# Patient Record
Sex: Female | Born: 1965 | Race: White | Hispanic: No | Marital: Married | Smoking: Former smoker
Health system: Southern US, Community
[De-identification: ages and names within clinical notes are randomized; demographics above are authoritative.]

## PROBLEM LIST (undated history)

## (undated) DIAGNOSIS — A630 Anogenital (venereal) warts: Secondary | ICD-10-CM

## (undated) DIAGNOSIS — Z860100 Personal history of colon polyps, unspecified: Secondary | ICD-10-CM

## (undated) DIAGNOSIS — G4700 Insomnia, unspecified: Secondary | ICD-10-CM

## (undated) DIAGNOSIS — M79604 Pain in right leg: Secondary | ICD-10-CM

## (undated) DIAGNOSIS — M79605 Pain in left leg: Secondary | ICD-10-CM

## (undated) DIAGNOSIS — K219 Gastro-esophageal reflux disease without esophagitis: Secondary | ICD-10-CM

## (undated) DIAGNOSIS — F988 Other specified behavioral and emotional disorders with onset usually occurring in childhood and adolescence: Secondary | ICD-10-CM

## (undated) DIAGNOSIS — E538 Deficiency of other specified B group vitamins: Secondary | ICD-10-CM

## (undated) DIAGNOSIS — Z8619 Personal history of other infectious and parasitic diseases: Secondary | ICD-10-CM

## (undated) DIAGNOSIS — E559 Vitamin D deficiency, unspecified: Secondary | ICD-10-CM

## (undated) DIAGNOSIS — D649 Anemia, unspecified: Secondary | ICD-10-CM

## (undated) DIAGNOSIS — F329 Major depressive disorder, single episode, unspecified: Secondary | ICD-10-CM

## (undated) DIAGNOSIS — Q823 Incontinentia pigmenti: Secondary | ICD-10-CM

## (undated) DIAGNOSIS — M797 Fibromyalgia: Secondary | ICD-10-CM

## (undated) DIAGNOSIS — F32A Depression, unspecified: Secondary | ICD-10-CM

## (undated) HISTORY — DX: Gastro-esophageal reflux disease without esophagitis: K21.9

## (undated) HISTORY — DX: Insomnia, unspecified: G47.00

## (undated) HISTORY — DX: Anemia, unspecified: D64.9

## (undated) HISTORY — DX: Deficiency of other specified B group vitamins: E53.8

## (undated) HISTORY — PX: ABDOMINAL HYSTERECTOMY: SHX81

## (undated) HISTORY — DX: Personal history of other infectious and parasitic diseases: Z86.19

## (undated) HISTORY — DX: Personal history of colonic polyps: Z86.010

## (undated) HISTORY — DX: Depression, unspecified: F32.A

## (undated) HISTORY — DX: Fibromyalgia: M79.7

## (undated) HISTORY — DX: Anogenital (venereal) warts: A63.0

## (undated) HISTORY — PX: OTHER SURGICAL HISTORY: SHX169

## (undated) HISTORY — PX: COLONOSCOPY: SHX174

## (undated) HISTORY — DX: Major depressive disorder, single episode, unspecified: F32.9

## (undated) HISTORY — DX: Other specified behavioral and emotional disorders with onset usually occurring in childhood and adolescence: F98.8

## (undated) HISTORY — DX: Personal history of colon polyps, unspecified: Z86.0100

## (undated) HISTORY — DX: Incontinentia pigmenti: Q82.3

## (undated) HISTORY — PX: GASTRIC BYPASS: SHX52

---

## 2002-02-13 ENCOUNTER — Other Ambulatory Visit: Admission: RE | Admit: 2002-02-13 | Discharge: 2002-02-13 | Payer: Self-pay | Admitting: Family Medicine

## 2005-06-23 ENCOUNTER — Ambulatory Visit: Payer: Self-pay | Admitting: Family Medicine

## 2005-07-06 ENCOUNTER — Encounter: Admission: RE | Admit: 2005-07-06 | Discharge: 2005-07-06 | Payer: Self-pay | Admitting: Family Medicine

## 2005-07-12 ENCOUNTER — Ambulatory Visit: Payer: Self-pay | Admitting: Family Medicine

## 2006-01-25 ENCOUNTER — Ambulatory Visit: Payer: Self-pay | Admitting: Family Medicine

## 2006-05-30 ENCOUNTER — Ambulatory Visit: Payer: Self-pay | Admitting: Family Medicine

## 2006-11-22 ENCOUNTER — Emergency Department (HOSPITAL_COMMUNITY): Admission: EM | Admit: 2006-11-22 | Discharge: 2006-11-22 | Payer: Self-pay | Admitting: Emergency Medicine

## 2007-01-25 ENCOUNTER — Ambulatory Visit: Payer: Self-pay | Admitting: Family Medicine

## 2007-01-25 DIAGNOSIS — G4700 Insomnia, unspecified: Secondary | ICD-10-CM | POA: Insufficient documentation

## 2007-01-25 DIAGNOSIS — Z860100 Personal history of colon polyps, unspecified: Secondary | ICD-10-CM | POA: Insufficient documentation

## 2007-01-25 DIAGNOSIS — K219 Gastro-esophageal reflux disease without esophagitis: Secondary | ICD-10-CM

## 2007-01-25 DIAGNOSIS — Z9189 Other specified personal risk factors, not elsewhere classified: Secondary | ICD-10-CM | POA: Insufficient documentation

## 2007-01-25 DIAGNOSIS — Q998 Other specified chromosome abnormalities: Secondary | ICD-10-CM

## 2007-01-25 DIAGNOSIS — L259 Unspecified contact dermatitis, unspecified cause: Secondary | ICD-10-CM | POA: Insufficient documentation

## 2007-01-25 DIAGNOSIS — A630 Anogenital (venereal) warts: Secondary | ICD-10-CM

## 2007-07-19 ENCOUNTER — Telehealth: Payer: Self-pay | Admitting: Family Medicine

## 2007-09-28 ENCOUNTER — Telehealth: Payer: Self-pay | Admitting: Family Medicine

## 2007-09-29 ENCOUNTER — Ambulatory Visit: Payer: Self-pay | Admitting: Family Medicine

## 2008-01-29 ENCOUNTER — Telehealth: Payer: Self-pay | Admitting: Family Medicine

## 2008-03-08 ENCOUNTER — Ambulatory Visit: Payer: Self-pay | Admitting: Family Medicine

## 2008-03-08 DIAGNOSIS — IMO0001 Reserved for inherently not codable concepts without codable children: Secondary | ICD-10-CM

## 2008-03-11 ENCOUNTER — Telehealth: Payer: Self-pay | Admitting: Family Medicine

## 2008-03-11 LAB — CONVERTED CEMR LAB
ALT: 18 units/L (ref 0–35)
AST: 17 units/L (ref 0–37)
Alkaline Phosphatase: 44 units/L (ref 39–117)
Anti Nuclear Antibody(ANA): NEGATIVE
BUN: 9 mg/dL (ref 6–23)
Bilirubin, Direct: 0.1 mg/dL (ref 0.0–0.3)
CO2: 28 meq/L (ref 19–32)
CRP, High Sensitivity: <1 — ABNORMAL LOW (ref 0.00–5.00)
Chloride: 108 meq/L (ref 96–112)
Creatinine, Ser: 0.6 mg/dL (ref 0.4–1.2)
Eosinophils Relative: 0.8 % (ref 0.0–5.0)
GFR calc Af Amer: 140 mL/min
Hemoglobin: 8.2 g/dL — ABNORMAL LOW (ref 12.0–15.0)
Lymphocytes Relative: 31.6 % (ref 12.0–46.0)
MCV: 70.3 fL — ABNORMAL LOW (ref 78.0–100.0)
Neutro Abs: 3.6 10*3/uL (ref 1.4–7.7)
Neutrophils Relative %: 58 % (ref 43.0–77.0)
Platelets: 264 10*3/uL (ref 150–400)
RBC: 3.64 M/uL — ABNORMAL LOW (ref 3.87–5.11)
RDW: 14.2 % (ref 11.5–14.6)
Rhuematoid fact SerPl-aCnc: <20 intl units/mL — ABNORMAL LOW (ref 0.0–20.0)
TSH: 1.39 microintl units/mL (ref 0.35–5.50)
Total Protein: 6.3 g/dL (ref 6.0–8.3)
WBC: 6.3 10*3/uL (ref 4.5–10.5)

## 2008-07-15 ENCOUNTER — Telehealth: Payer: Self-pay | Admitting: Family Medicine

## 2008-10-24 IMAGING — CR DG ABDOMEN ACUTE W/ 1V CHEST
3 series · 3 of 3 positions shown · non-contrast
Comparison: None available.

CLINICAL DATA: Chest pain, abdominal pain. 
 ACUTE ABDOMEN WITH CHEST ? 11/22/06:

[w chest pa]
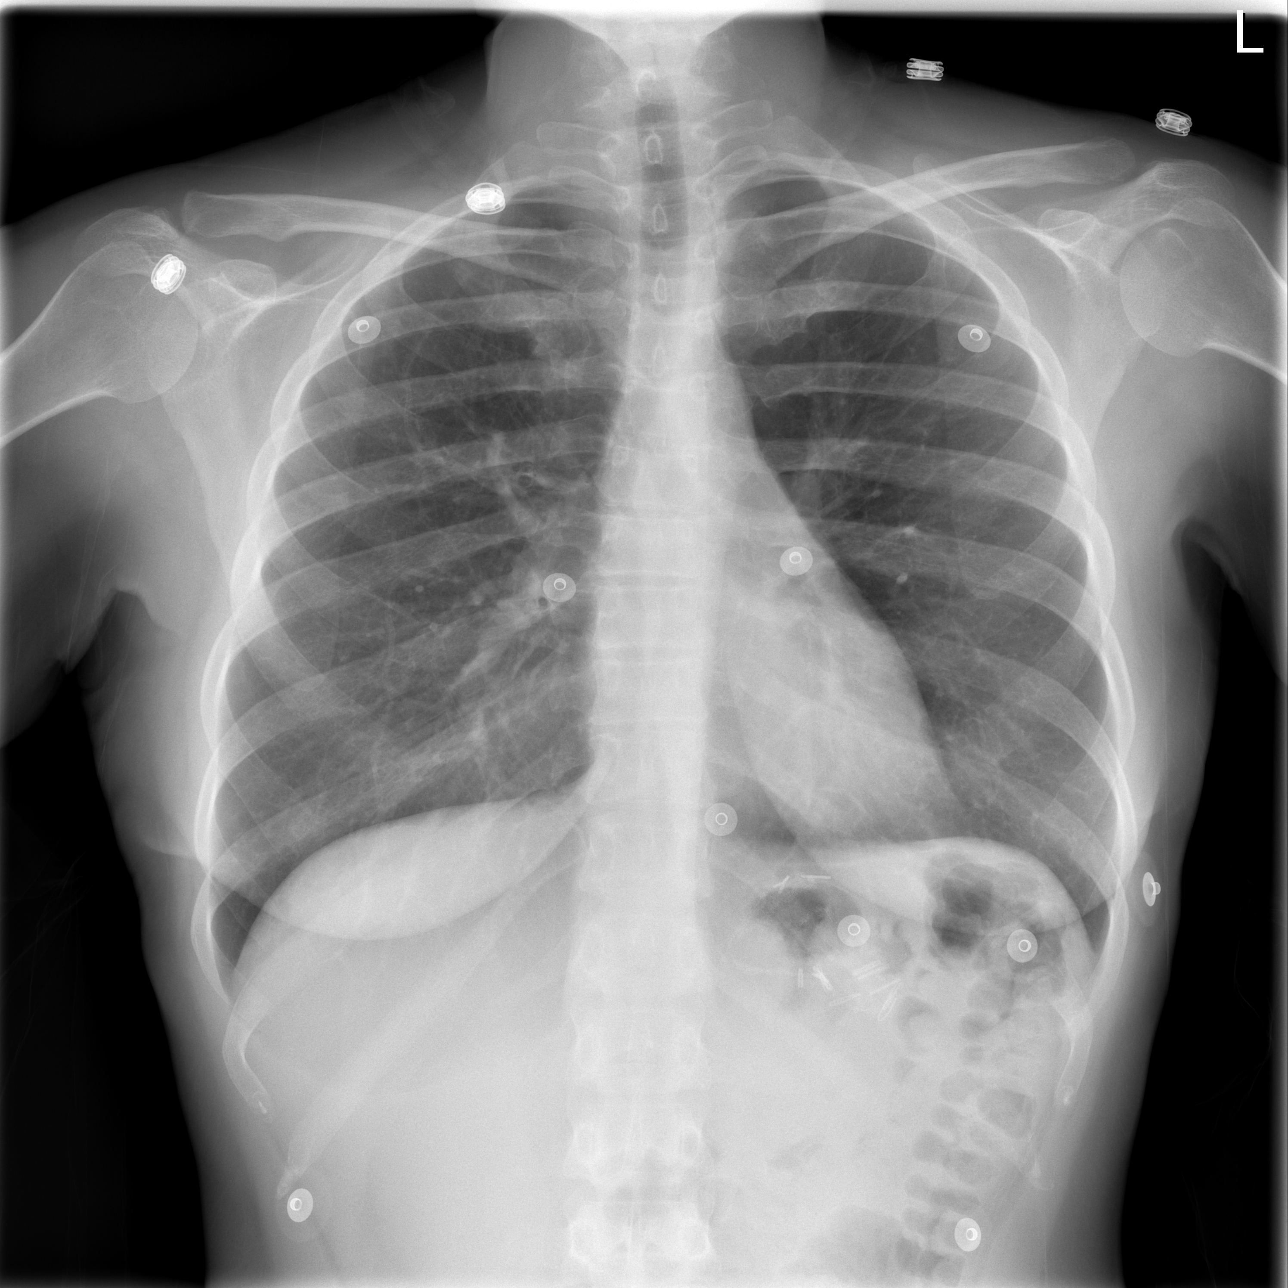

[w abdomen upright]
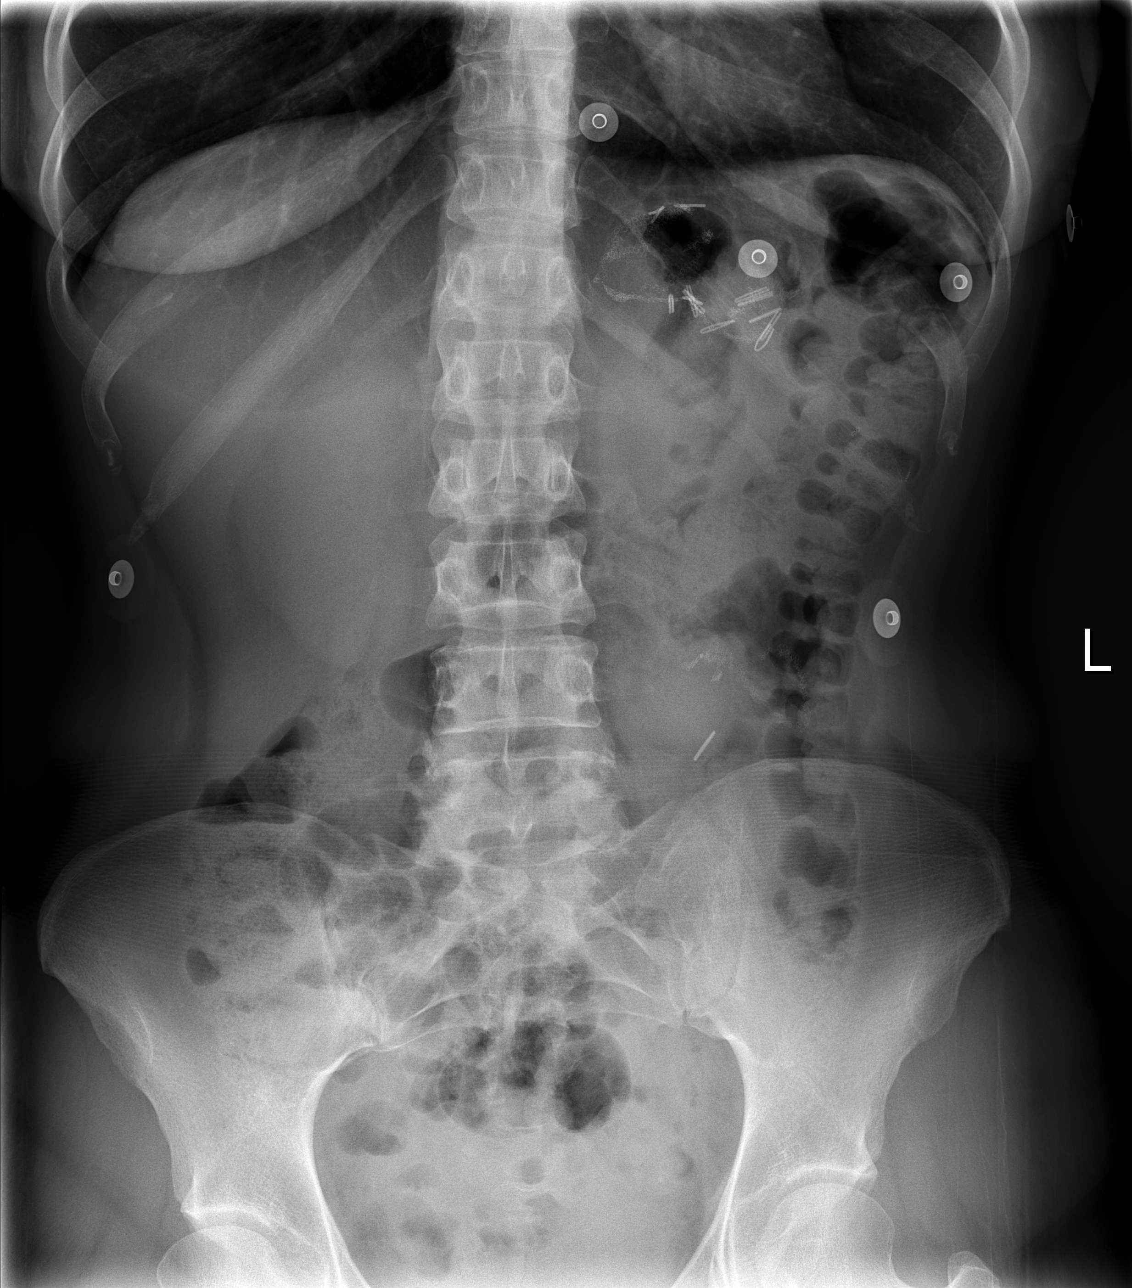

[t abdomen supine]
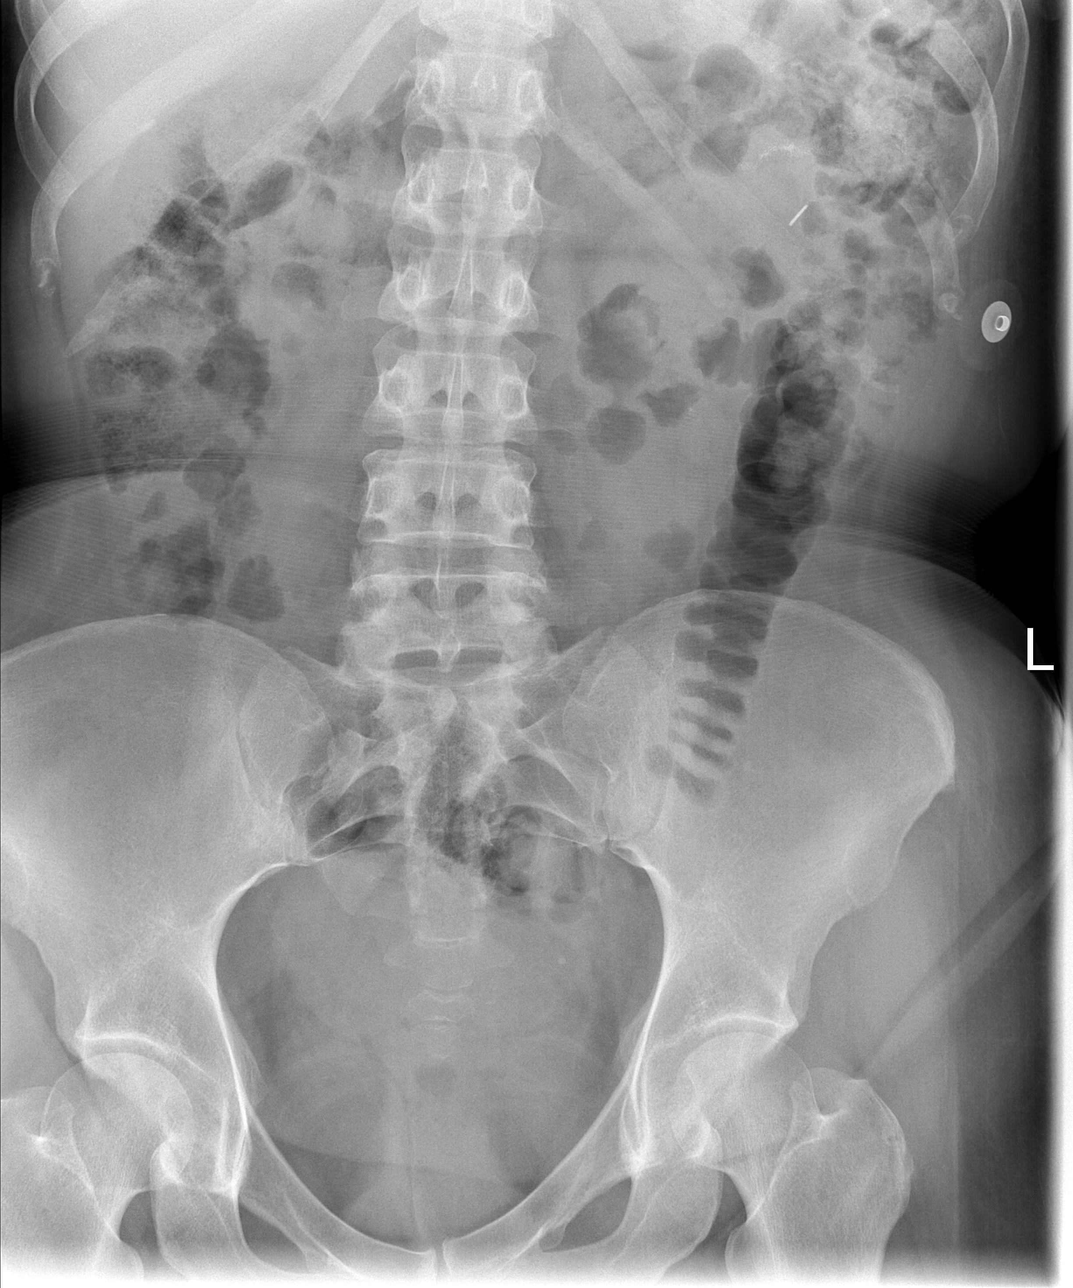

[3 of 3 positions shown; findings below may reference images not displayed]

FINDINGS: Normal mediastinum and cardiac silhouette.  No evidence of free air beneath the hemidiaphragms. Normal pulmonary vasculature.  No evidence of infiltrate.  Supine and upright views of the abdomen demonstrate no evidence of dilated loops of large or small bowel.  Gas is present in the rectum.   Formed stool is present in the ascending and transverse colon.   Multiple surgical clips in the left upper quadrant, most consistent with gastric bypass surgery.  No pathologic calcifications evident.
IMPRESSION: 1.  No evidence of intraperitoneal free air. 
 2.  No evidence of bowel obstruction. 
 3.   No acute cardiopulmonary disease.

## 2008-10-24 IMAGING — CT CT ABDOMEN W/ CM
2 of 5 series · 17 of 46 positions shown, 19 images · IV contrast (APPLIED)
Comparison: None available.

CLINICAL DATA: Left chest and epigastric pain for approximately two months, worsening over the last day.  History of gastric bypass one year ago.  
 ABDOMEN CT WITH CONTRAST:
TECHNIQUE: Multidetector CT imaging of the abdomen was performed following the standard protocol during bolus administration of intravenous contrast.
 Contrast:  100 cc Omnipaque 300.  Oral contrast was given.
TECHNIQUE: Multidetector CT imaging of the pelvis was performed following the standard protocol during bolus administration of intravenous contrast.

[Series 2: abd/pelv with 5.0 b31f st · axial · 0.71mm/px · z∈[-486,-76]mm · 14 of 92 slices shown, 16 images]
[im 5/92  soft-tissue]
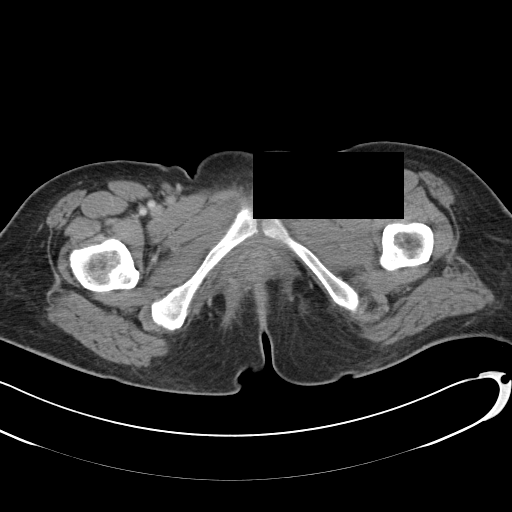
[im 5/92  bone]
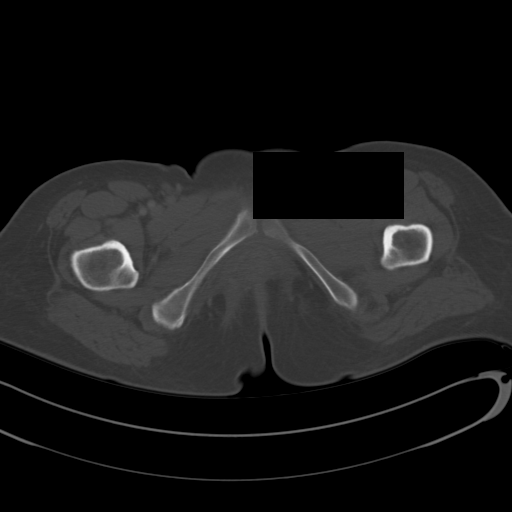
[im 10/92  soft-tissue]
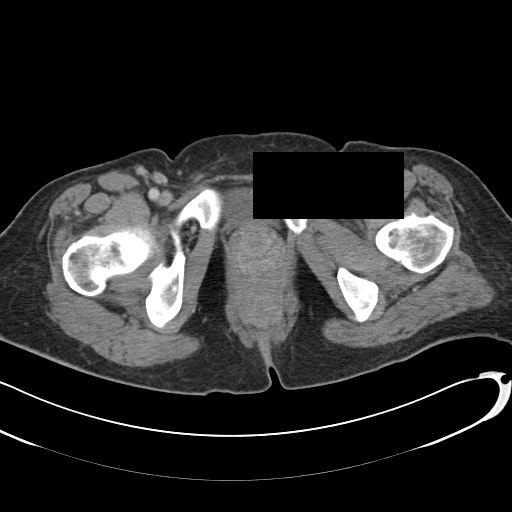
[im 20/92  soft-tissue]
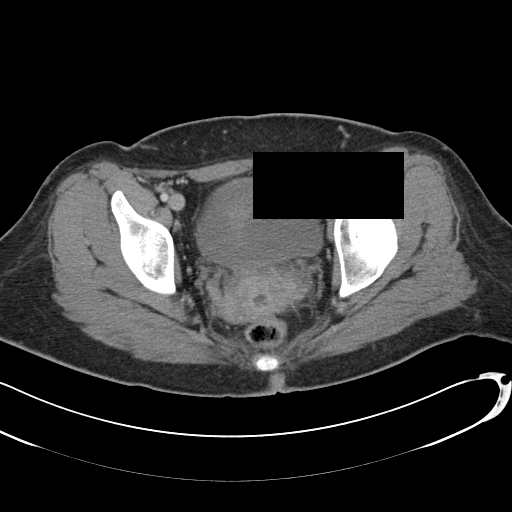
[im 24/92  soft-tissue]
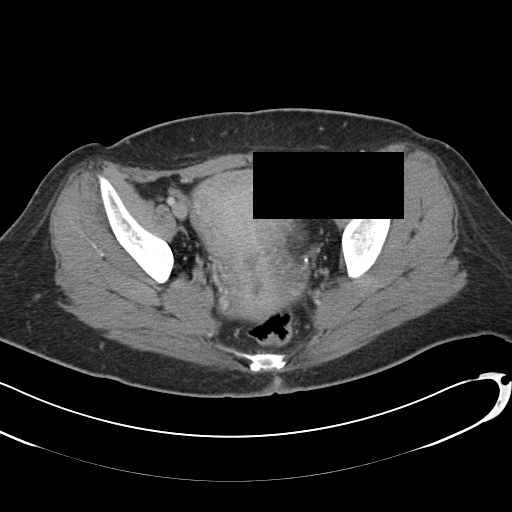
[im 29/92  soft-tissue]
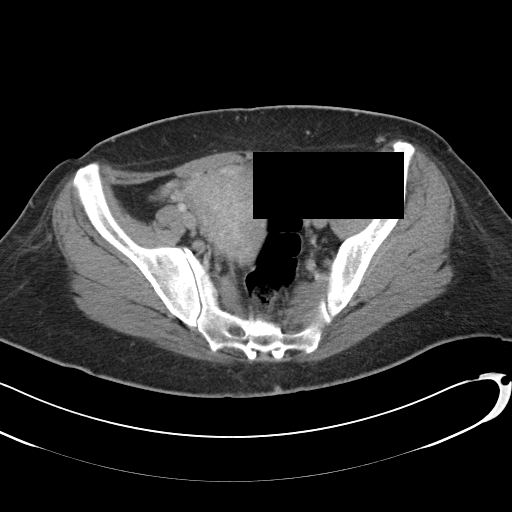
[im 39/92  soft-tissue]
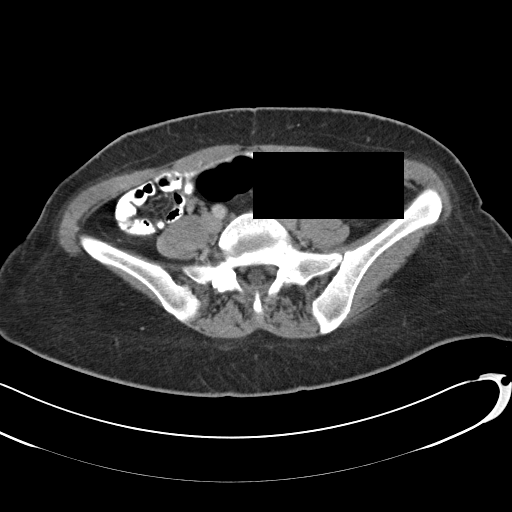
[im 44/92  soft-tissue]
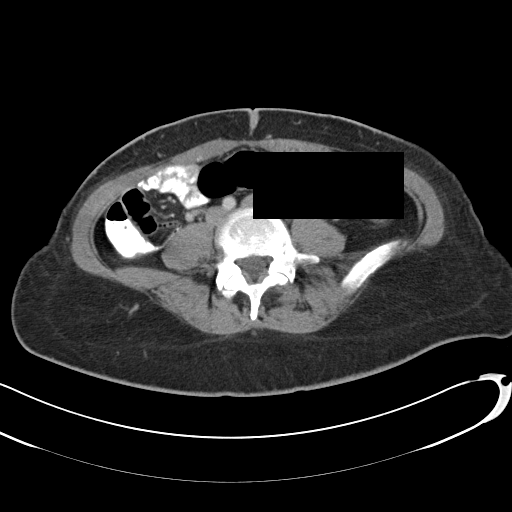
[im 48/92  soft-tissue]
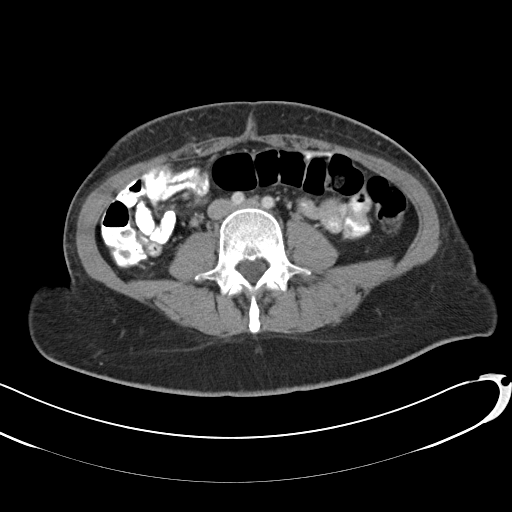
[im 53/92  soft-tissue]
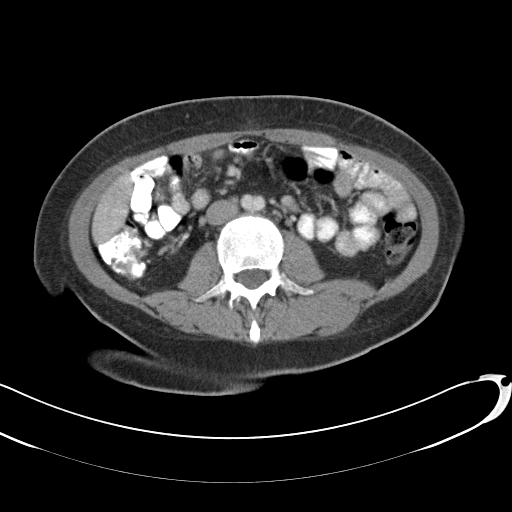
[im 53/92  bone]
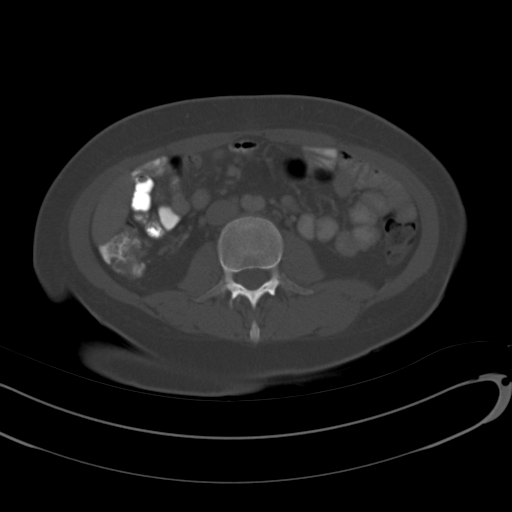
[im 63/92  soft-tissue]
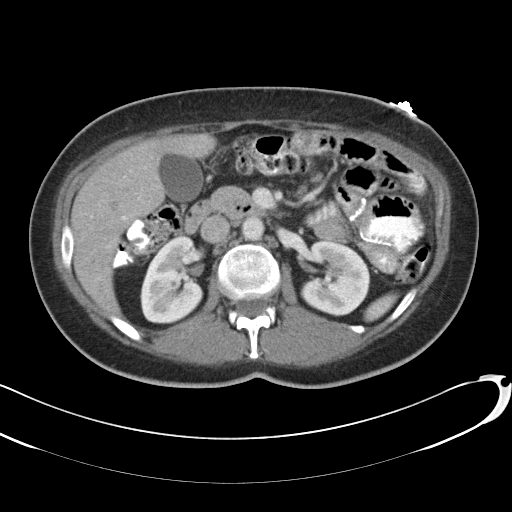
[im 68/92  soft-tissue]
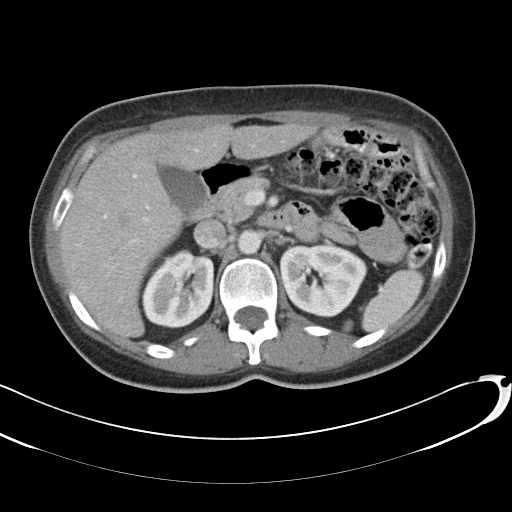
[im 72/92  soft-tissue]
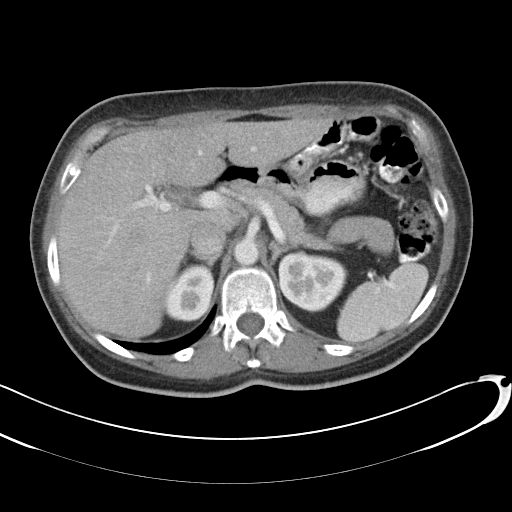
[im 82/92  soft-tissue]
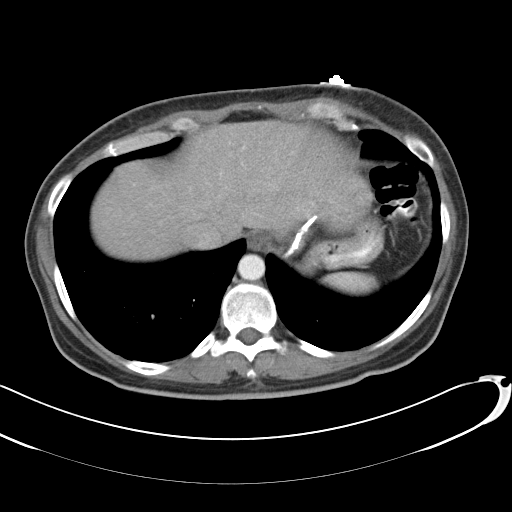
[im 87/92  soft-tissue]
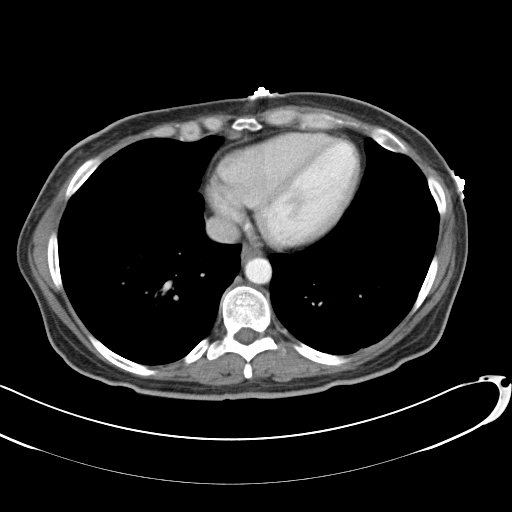

[Series 5: abd/pelv with 2.0 spo st cor · coronal · 0.90mm/px · 3 of 97 slices shown]
[im 33/97  soft-tissue]
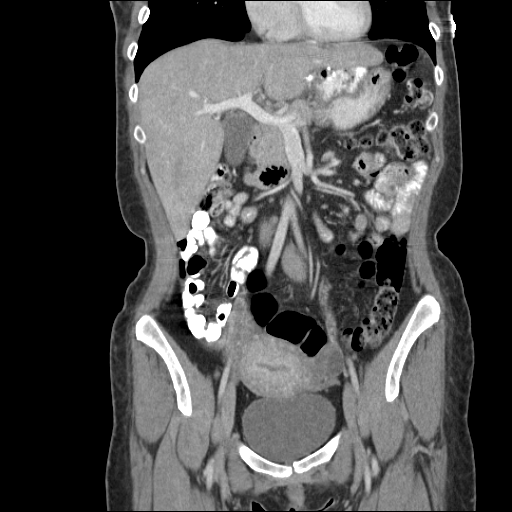
[im 43/97  soft-tissue]
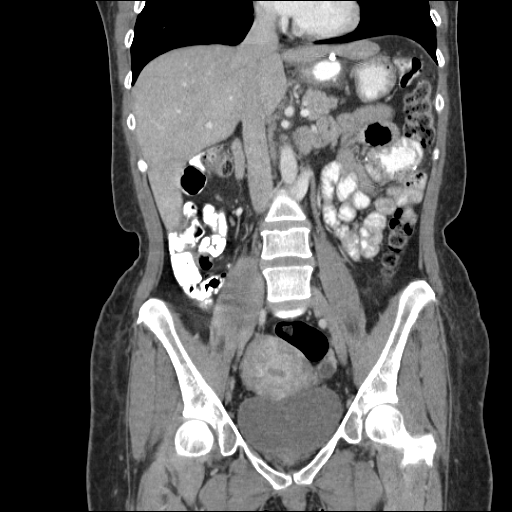
[im 54/97  soft-tissue]
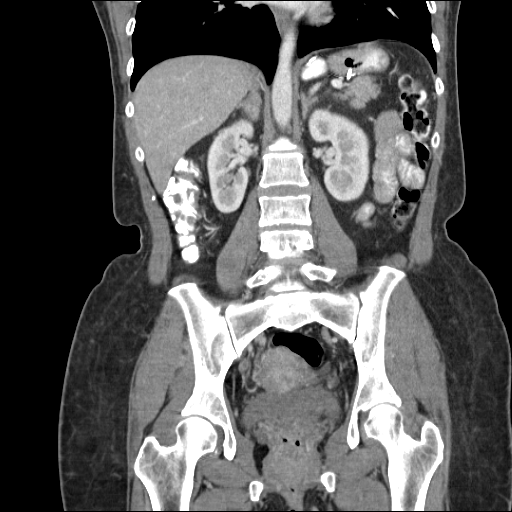

[17 of 46 positions shown; findings below may reference images not displayed]

FINDINGS: The lung bases are clear.  There is no pleural effusion.  Postsurgical changes are noted status post gastric bypass procedure.  No inflammatory process, fluid collection or mass is seen in the left upper quadrant of the abdomen.  Accessory splenic tissue is noted.  There are normal size lymph nodes in the small bowel mesentery.  
 The liver, gallbladder, pancreas, adrenal glands and kidneys appear normal.  There is no bowel distention.
IMPRESSION: Normal examination post gastric bypass.  No acute findings.
 PELVIS CT WITH CONTRAST:
FINDINGS: The left ovary is mildly prominent with low-density lesions which may reflect cysts.  The posterior component is somewhat tubular and may represent a mild hydrosalpinx.  The greatest dimension of the left adnexa is 6.1 cm on the sagittal images.   The right ovary appears normal.  There is mildly heterogeneous uterine enhancement without focal mass.  No free fluid or inflammatory process is seen.  On review of the bone windows, there are no suspicious lesions.
IMPRESSION: Possible small left ovarian cyst vs hydrosalpinx.  Otherwise unremarkable examination.

## 2008-12-02 ENCOUNTER — Ambulatory Visit: Payer: Self-pay | Admitting: Family Medicine

## 2008-12-02 DIAGNOSIS — M542 Cervicalgia: Secondary | ICD-10-CM | POA: Insufficient documentation

## 2009-01-01 ENCOUNTER — Telehealth: Payer: Self-pay | Admitting: Family Medicine

## 2009-03-10 ENCOUNTER — Telehealth: Payer: Self-pay | Admitting: Family Medicine

## 2009-05-31 ENCOUNTER — Ambulatory Visit: Payer: Self-pay | Admitting: Internal Medicine

## 2009-05-31 DIAGNOSIS — L255 Unspecified contact dermatitis due to plants, except food: Secondary | ICD-10-CM | POA: Insufficient documentation

## 2009-07-04 ENCOUNTER — Telehealth: Payer: Self-pay | Admitting: Family Medicine

## 2009-08-25 ENCOUNTER — Ambulatory Visit: Payer: Self-pay | Admitting: Family Medicine

## 2009-08-25 DIAGNOSIS — F329 Major depressive disorder, single episode, unspecified: Secondary | ICD-10-CM

## 2009-09-08 ENCOUNTER — Ambulatory Visit: Payer: Self-pay | Admitting: Family Medicine

## 2009-11-10 ENCOUNTER — Ambulatory Visit: Payer: Self-pay | Admitting: Family Medicine

## 2009-11-10 DIAGNOSIS — H6980 Other specified disorders of Eustachian tube, unspecified ear: Secondary | ICD-10-CM

## 2010-01-05 ENCOUNTER — Ambulatory Visit: Payer: Self-pay | Admitting: Family Medicine

## 2010-01-05 DIAGNOSIS — J019 Acute sinusitis, unspecified: Secondary | ICD-10-CM

## 2010-02-10 ENCOUNTER — Telehealth: Payer: Self-pay | Admitting: Family Medicine

## 2010-02-24 ENCOUNTER — Telehealth: Payer: Self-pay | Admitting: Family Medicine

## 2010-03-10 NOTE — Progress Notes (Signed)
Summary: refill  Phone Note From Pharmacy   [facility]  [place] [address] Reason for Call: Patient requests substitution Summary of Call: needs refills of Temazepam 15mg  at bedtime . Call in #30 with 5 rf Initial call taken by: Nelwyn Salisbury MD,  Jul 04, 2009 3:02 PM    Prescriptions: TEMAZEPAM 15 MG  CAPS (TEMAZEPAM) at bedtime  #30 x 5   Entered by:   Raechel Ache, RN   Authorized by:   Nelwyn Salisbury MD   Signed by:   Raechel Ache, RN on 07/04/2009   Method used:   Historical   RxID:   [id number]

## 2010-03-10 NOTE — Assessment & Plan Note (Signed)
Summary: ? flu//ccm   Vital Signs:  Patient profile:   45 year old female O2 Sat:      93 % Temp:     98.3 degrees F Pulse rate:   113 / minute BP sitting:   104 / 68  (left arm) Cuff size:   regular  Vitals Entered By: Pura Spice, RN (January 05, 2010 2:45 PM) CC: snezzing sore throat cough chest hurts tired.   History of Present Illness: Here for 3 days of sinus pressure, PND, ST, dry cough, and some nausea without vomiting. No fever. On Mucinex D.  Allergies (verified): No Known Drug Allergies  Past History:  Past Medical History: Reviewed history from 09/08/2009 and no changes required. Colonic polyps, hx of GERD genital wart, hx of X-chromosome linked syndrome causes skin pigmentation fibromyalgia Depression  Review of Systems  The patient denies anorexia, fever, weight loss, weight gain, vision loss, decreased hearing, hoarseness, chest pain, syncope, dyspnea on exertion, peripheral edema, hemoptysis, abdominal pain, melena, hematochezia, severe indigestion/heartburn, hematuria, incontinence, genital sores, muscle weakness, suspicious skin lesions, transient blindness, difficulty walking, depression, unusual weight change, abnormal bleeding, enlarged lymph nodes, angioedema, breast masses, and testicular masses.    Physical Exam  General:  Well-developed,well-nourished,in no acute distress; alert,appropriate and cooperative throughout examination Head:  Normocephalic and atraumatic without obvious abnormalities. No apparent alopecia or balding. Eyes:  No corneal or conjunctival inflammation noted. EOMI. Perrla. Funduscopic exam benign, without hemorrhages, exudates or papilledema. Vision grossly normal. Ears:  External ear exam shows no significant lesions or deformities.  Otoscopic examination reveals clear canals, tympanic membranes are intact bilaterally without bulging, retraction, inflammation or discharge. Hearing is grossly normal bilaterally. Nose:   External nasal examination shows no deformity or inflammation. Nasal mucosa are pink and moist without lesions or exudates. Mouth:  Oral mucosa and oropharynx without lesions or exudates.  Teeth in good repair. Neck:  No deformities, masses, or tenderness noted. Lungs:  Normal respiratory effort, chest expands symmetrically. Lungs are clear to auscultation, no crackles or wheezes.   Impression & Recommendations:  Problem # 1:  ACUTE SINUSITIS, UNSPECIFIED (ICD-461.9)  Her updated medication list for this problem includes:    Zithromax Z-pak 250 Mg Tabs (Azithromycin) .Marland Kitchen... As directed  Complete Medication List: 1)  Flexeril 10 Mg Tabs (Cyclobenzaprine hcl) .... Three times a day as needed spasm 2)  Promethazine Hcl 25 Mg Tabs (Promethazine hcl) .... 1/2 tablet every 6 hours as needed for nausea 3)  Omeprazole 40 Mg Cpdr (Omeprazole) .... Two times a day 4)  Vicodin Hp 10-660 Mg Tabs (Hydrocodone-acetaminophen) .Marland Kitchen.. 1 q 6 hours as needed pain 5)  Zithromax Z-pak 250 Mg Tabs (Azithromycin) .... As directed  Patient Instructions: 1)  Please schedule a follow-up appointment as needed .  Prescriptions: ZITHROMAX Z-PAK 250 MG TABS (AZITHROMYCIN) as directed  #1 x 0   Entered and Authorized by:   Nelwyn Salisbury MD   Signed by:   Nelwyn Salisbury MD on 01/05/2010   Method used:   [facility]        [facility]  [place] [address] [facility]       [address]       [place], [place]  [address]       Ph: [phone] or [phone]       Fax: [phone]   RxID:   [id number]    Orders Added: 1)  Est. Patient Level IV [27253]

## 2010-03-10 NOTE — Assessment & Plan Note (Signed)
Summary: POISON IVY/LD   Vital Signs:  Patient profile:   45 year old female Weight:      142 pounds Temp:     98.6 degrees F oral BP sitting:   110 / 60  (left arm)  Vitals Entered By: Doristine Devoid (May 31, 2009 11:25 AM) CC: poison ivy on R cheeck and arms   History of Present Illness: Diane Gibbs comes in comes in today  for saturday clinic with daughter for 2 day hx of itchy PI type rash that began on her arms and now face over right eye with some swelling and itching. She has a hx of sensitiviy to PI but had to pull vines   recently and tried to avoid exposures.     No rx   recently . NO resp signs or vision change  or fever.  Also  on sleep medicine  and  would like refil f or now as steroids also causes insomnia for her.    Preventive Screening-Counseling & Management  Alcohol-Tobacco     Smoking Status: current  Allergies: No Known Drug Allergies  Past History:  Past medical, surgical, family and social histories (including risk factors) reviewed for relevance to current acute and chronic problems.  Past Medical History: Reviewed history from 01/25/2007 and no changes required. Colonic polyps, hx of GERD genital wart, hx of X-chromosome linked syndrome causes skin pigmentation  Past Surgical History: Reviewed history from 01/25/2007 and no changes required. Sphincterotomy Bilateral breast reduction  Family History: Reviewed history from 01/25/2007 and no changes required. Family History of Colon CA 1st degree relative <60 Family History Depression Family History Hypertension  Social History: Reviewed history from 01/25/2007 and no changes required. Divorced Current Smoker Alcohol use-yes Drug use-no Married  Review of Systems  The patient denies anorexia, fever, weight loss, weight gain, vision loss, decreased hearing, hoarseness, chest pain, syncope, abdominal pain, enlarged lymph nodes, and angioedema.    Physical Exam  General:   alert, well-developed, well-nourished, and well-hydrated.  with some rash and swelling around left eye  with rash  Head:  normocephalic and atraumatic.   Eyes:  PERRL, EOMs full, conjunctiva clear  Ears:  R ear normal and L ear normal.   Mouth:  pharynx pink and moist.   Neck:  No deformities, masses, or tenderness noted. Lungs:  normal respiratory effort, no intercostal retractions, no accessory muscle use, and normal breath sounds.   Heart:  normal rate and regular rhythm.   Skin:  contact derm rash on right eyellid and below and right neck   also small paules on both forearms   back clear   turgor normal and tattoo(s).   Cervical Nodes:  No lymphadenopathy noted Psych:  Oriented X3, good eye contact, not anxious appearing, and not depressed appearing.     Impression & Recommendations:  Problem # 1:  RHUS DERMATITIS (ICD-692.6) disc about steroid taper and topicals  and other strategies for avoidance and rx  Her updated medication list for this problem includes:    Triamcinolone Acetonide 0.5 % Crea (Triamcinolone acetonide) .Marland Kitchen... Apply two times a day as needed rash    Promethazine Hcl 25 Mg Tabs (Promethazine hcl) .Marland Kitchen... 1/2 tablet every 6 hours as needed for nausea    Prednisone 20 Mg Tabs (Prednisone) .Marland Kitchen... Take 3 a day x 3 days then 2 a day x3 days then 1/day x 3 days, then 1/2 a day x 3 days or as directed.    Fluocinonide 0.05 % Crea (  Fluocinonide) .Marland Kitchen... Apply two times a day to poison ivy  rash   not on face  Problem # 2:  INSOMNIA (ICD-780.52) refill med today and have her arrange rov for med check with Dr Clent Ridges  .    Her updated medication list for this problem includes:    Temazepam 15 Mg Caps (Temazepam) .Marland Kitchen... At bedtime  Complete Medication List: 1)  Triamcinolone Acetonide 0.5 % Crea (Triamcinolone acetonide) .... Apply two times a day as needed rash 2)  Eql Omeprazole 20 Mg Tbec (Omeprazole) .... Once daily 3)  Temazepam 15 Mg Caps (Temazepam) .... At bedtime 4)   Ferrous Fumarate 325 Mg Tabs (Ferrous fumarate) .Marland Kitchen.. 1 by mouth once daily 5)  Furosemide 20 Mg Tabs (Furosemide) .... Take 1 tab by mouth every day 6)  Vitamin D 91478 Unit Caps (Ergocalciferol) .Marland Kitchen.. 1 by mouth weekly 7)  Flexeril 10 Mg Tabs (Cyclobenzaprine hcl) .... Three times a day as needed spasm 8)  Promethazine Hcl 25 Mg Tabs (Promethazine hcl) .... 1/2 tablet every 6 hours as needed for nausea 9)  Prednisone 20 Mg Tabs (Prednisone) .... Take 3 a day x 3 days then 2 a day x3 days then 1/day x 3 days, then 1/2 a day x 3 days or as directed. 10)  Fluocinonide 0.05 % Crea (Fluocinonide) .... Apply two times a day to poison ivy  rash   not on face  Patient Instructions: 1)  begin steroid pills today  2)  cream as needed.expecially usefull for early rash. 3)  can taper  more quickly if  can.   4)  call if needed . Prevention.  Prescriptions: TEMAZEPAM 15 MG  CAPS (TEMAZEPAM) at bedtime  #30 x 0   Entered and Authorized by:   Madelin Headings MD   Signed by:   Madelin Headings MD on 05/31/2009   Method used:   Print then Give to Patient   RxID:   [id number] FLUOCINONIDE 0.05 % CREA (FLUOCINONIDE) apply two times a day to poison ivy  rash   not on face  #30 gram x 1   Entered and Authorized by:   Madelin Headings MD   Signed by:   Madelin Headings MD on 05/31/2009   Method used:   [facility]        [facility]  [place] [address] [facility]       [address]       [place], [place]  [address]       Ph: [phone] or [phone]       Fax: [phone]   RxID:   [id number] PREDNISONE 20 MG TABS (PREDNISONE) Take 3 a day x 3 days then 2 a day x3 days then 1/day x 3 days, then 1/2 a day x 3 days or as directed.  #30 x 0   Entered and Authorized by:   Madelin Headings MD   Signed by:   Madelin Headings MD on 05/31/2009   Method used:   [facility]        [facility]  [place] [address]       [address]       [place], [place]  [address]       Ph: [phone] or [phone]        Fax: [phone]   RxID:   [id number]

## 2010-03-10 NOTE — Assessment & Plan Note (Signed)
Summary: ear issues/ccm   Vital Signs:  Patient profile:   45 year old female Weight:      140 pounds O2 Sat:      98 % Pulse rate:   89 / minute BP sitting:   104 / 72 Cuff size:   regular  Vitals Entered By: Pura Spice, RN (November 10, 2009 8:43 AM) CC: pain left ear for 2 wks. wants something else for sleep and pain   Contraindications/Deferment of Procedures/Staging:    Test/Procedure: FLU VAX    Reason for deferment: patient declined   History of Present Illness: Here for 2 weeks of a pressure sensation in the left ear.No pain or URI symptoms. Antihistamines do not help. Also, she wants to try a differne tmed for sleep because when she takes temazepam she ends up doing thing s that she does not remember (like baking cookies). She wants her pain meds increased also.   Allergies (verified): No Known Drug Allergies  Past History:  Past Medical History: Reviewed history from 09/08/2009 and no changes required. Colonic polyps, hx of GERD genital wart, hx of X-chromosome linked syndrome causes skin pigmentation fibromyalgia Depression  Review of Systems  The patient denies anorexia, fever, weight loss, weight gain, vision loss, decreased hearing, hoarseness, chest pain, syncope, dyspnea on exertion, peripheral edema, prolonged cough, headaches, hemoptysis, abdominal pain, melena, hematochezia, severe indigestion/heartburn, hematuria, incontinence, genital sores, muscle weakness, suspicious skin lesions, transient blindness, difficulty walking, depression, unusual weight change, abnormal bleeding, enlarged lymph nodes, angioedema, breast masses, and testicular masses.    Physical Exam  General:  Well-developed,well-nourished,in no acute distress; alert,appropriate and cooperative throughout examination Head:  Normocephalic and atraumatic without obvious abnormalities. No apparent alopecia or balding. Eyes:  No corneal or conjunctival inflammation noted. EOMI. Perrla.  Funduscopic exam benign, without hemorrhages, exudates or papilledema. Vision grossly normal. Ears:  External ear exam shows no significant lesions or deformities.  Otoscopic examination reveals clear canals, tympanic membranes are intact bilaterally without bulging, retraction, inflammation or discharge. Hearing is grossly normal bilaterally. Nose:  External nasal examination shows no deformity or inflammation. Nasal mucosa are pink and moist without lesions or exudates. Mouth:  Oral mucosa and oropharynx without lesions or exudates.  Teeth in good repair. Neck:  No deformities, masses, or tenderness noted. Psych:  Cognition and judgment appear intact. Alert and cooperative with normal attention span and concentration. No apparent delusions, illusions, hallucinations   Impression & Recommendations:  Problem # 1:  EUSTACHIAN TUBE DYSFUNCTION (ICD-381.81)  Problem # 2:  FIBROMYALGIA (ICD-729.1)  The following medications were removed from the medication list:    Tramadol Hcl 50 Mg Tabs (Tramadol hcl) .Marland Kitchen... 1-2 q 6 hours as needed for pain    Vicodin 5-500 Mg Tabs (Hydrocodone-acetaminophen) .Marland Kitchen... 1 q 6 hours as needed pain Her updated medication list for this problem includes:    Flexeril 10 Mg Tabs (Cyclobenzaprine hcl) .Marland Kitchen... Three times a day as needed spasm    Vicodin Hp 10-660 Mg Tabs (Hydrocodone-acetaminophen) .Marland Kitchen... 1 q 6 hours as needed pain  Problem # 3:  INSOMNIA (ICD-780.52)  The following medications were removed from the medication list:    Temazepam 30 Mg Caps (Temazepam) .Marland Kitchen... At bedtime  Complete Medication List: 1)  Flexeril 10 Mg Tabs (Cyclobenzaprine hcl) .... Three times a day as needed spasm 2)  Promethazine Hcl 25 Mg Tabs (Promethazine hcl) .... 1/2 tablet every 6 hours as needed for nausea 3)  Omeprazole 40 Mg Cpdr (Omeprazole) .... Two times  a day 4)  Vicodin Hp 10-660 Mg Tabs (Hydrocodone-acetaminophen) .Marland Kitchen.. 1 q 6 hours as needed pain 5)  Alprazolam 1 Mg Tabs  (Alprazolam) .Marland Kitchen.. 1-2 tabs at bedtime  Patient Instructions: 1)  Please schedule a follow-up appointment in 3 months .  Prescriptions: ALPRAZOLAM 1 MG TABS (ALPRAZOLAM) 1-2 tabs at bedtime  #60 x 2   Entered and Authorized by:   Nelwyn Salisbury MD   Signed by:   Nelwyn Salisbury MD on 11/10/2009   Method used:   Print then Give to Patient   RxID:   [id number] VICODIN HP 10-660 MG TABS (HYDROCODONE-ACETAMINOPHEN) 1 q 6 hours as needed pain  #120 x 2   Entered and Authorized by:   Nelwyn Salisbury MD   Signed by:   Nelwyn Salisbury MD on 11/10/2009   Method used:   Print then Give to Patient   RxID:   [id number]

## 2010-03-10 NOTE — Progress Notes (Signed)
Summary: refill promethazine  Phone Note From Pharmacy   [facility]  [place] [address] Call For: Diane Gibbs  Summary of Call: refill promethazine 25mg  take 1/2 tab every 6 hours as needed for nausea Initial call taken by: Alfred Levins, CMA,  March 10, 2009 12:53 PM  Follow-up for Phone Call        call in #30 with 5 rf Follow-up by: Nelwyn Salisbury MD,  March 10, 2009 1:46 PM  Additional Follow-up for Phone Call Additional follow up Details #1::        Phone call completed, Pharmacist called Additional Follow-up by: Alfred Levins, CMA,  March 10, 2009 2:02 PM    New/Updated Medications: PROMETHAZINE HCL 25 MG  TABS (PROMETHAZINE HCL) 1/2 tablet every 6 hours as needed for nausea Prescriptions: PROMETHAZINE HCL 25 MG  TABS (PROMETHAZINE HCL) 1/2 tablet every 6 hours as needed for nausea  #30 x 5   Entered by:   Alfred Levins, CMA   Authorized by:   Nelwyn Salisbury MD   Signed by:   Alfred Levins, CMA on 03/10/2009   Method used:   [facility]        [facility]  [place] [address]       [address]       [place], [place]  [address]       Ph: [phone] or [phone]       Fax: [phone]   RxID:   [id number]

## 2010-03-10 NOTE — Assessment & Plan Note (Signed)
Summary: 2 wk rov/njr   Vital Signs:  Patient profile:   45 year old female Weight:      139 pounds Temp:     98.1 degrees F oral Pulse rate:   114 / minute BP sitting:   110 / 80  (left arm) Cuff size:   regular  Vitals Entered By: Kathrynn Speed CMA (September 08, 2009 11:08 AM) CC: 2 wk rov for body aches, meds not working certain times of day,src   History of Present Illness: Here to follow up on fibromyalgia and depression. She is very pleased with Prozac, and she wants to stay on this. Her moods are more stable, and she is not as stressed about things. Her daughter will be going to college in 2 weeks, and this will alleviate a lot of her stress also. She has been getting more exercise, and this has helped her muscle aches. Tramadol works well during the day, but at night she still has aches despite taking two Tramadols at a time.   Current Medications (verified): 1)  Furosemide 20 Mg Tabs (Furosemide) .... Take 1 Tab By Mouth Every Day 2)  Flexeril 10 Mg Tabs (Cyclobenzaprine Hcl) .... Three Times A Day As Needed Spasm 3)  Promethazine Hcl 25 Mg  Tabs (Promethazine Hcl) .... 1/2 Tablet Every 6 Hours As Needed For Nausea 4)  Omeprazole 40 Mg Cpdr (Omeprazole) .... Two Times A Day 5)  Tramadol Hcl 50 Mg Tabs (Tramadol Hcl) .Marland Kitchen.. 1-2 Q 6 Hours As Needed For Pain 6)  Prozac 20 Mg Caps (Fluoxetine Hcl) .... Once Daily 7)  Temazepam 30 Mg Caps (Temazepam) .... At Bedtime  Allergies (verified): No Known Drug Allergies  Past History:  Past Medical History: Colonic polyps, hx of GERD genital wart, hx of X-chromosome linked syndrome causes skin pigmentation fibromyalgia Depression  Review of Systems  The patient denies anorexia, fever, weight loss, weight gain, vision loss, decreased hearing, hoarseness, chest pain, syncope, dyspnea on exertion, peripheral edema, prolonged cough, headaches, hemoptysis, abdominal pain, melena, hematochezia, severe indigestion/heartburn, hematuria,  incontinence, genital sores, muscle weakness, suspicious skin lesions, transient blindness, difficulty walking, unusual weight change, abnormal bleeding, enlarged lymph nodes, angioedema, breast masses, and testicular masses.    Physical Exam  General:  Well-developed,well-nourished,in no acute distress; alert,appropriate and cooperative throughout examination Psych:  Cognition and judgment appear intact. Alert and cooperative with normal attention span and concentration. No apparent delusions, illusions, hallucinations   Impression & Recommendations:  Problem # 1:  DEPRESSION (ICD-311) Assessment Improved  Her updated medication list for this problem includes:    Prozac 20 Mg Caps (Fluoxetine hcl) ..... Once daily  Problem # 2:  FIBROMYALGIA (ICD-729.1) Assessment: Improved  Her updated medication list for this problem includes:    Flexeril 10 Mg Tabs (Cyclobenzaprine hcl) .Marland Kitchen... Three times a day as needed spasm    Tramadol Hcl 50 Mg Tabs (Tramadol hcl) .Marland Kitchen... 1-2 q 6 hours as needed for pain    Vicodin 5-500 Mg Tabs (Hydrocodone-acetaminophen) .Marland Kitchen... 1 q 6 hours as needed pain  Complete Medication List: 1)  Furosemide 20 Mg Tabs (Furosemide) .... Take 1 tab by mouth every day 2)  Flexeril 10 Mg Tabs (Cyclobenzaprine hcl) .... Three times a day as needed spasm 3)  Promethazine Hcl 25 Mg Tabs (Promethazine hcl) .... 1/2 tablet every 6 hours as needed for nausea 4)  Omeprazole 40 Mg Cpdr (Omeprazole) .... Two times a day 5)  Tramadol Hcl 50 Mg Tabs (Tramadol hcl) .Marland Kitchen.. 1-2 q  6 hours as needed for pain 6)  Prozac 20 Mg Caps (Fluoxetine hcl) .... Once daily 7)  Temazepam 30 Mg Caps (Temazepam) .... At bedtime 8)  Vicodin 5-500 Mg Tabs (Hydrocodone-acetaminophen) .Marland Kitchen.. 1 q 6 hours as needed pain  Patient Instructions: 1)  We spent 30 minutes discussing these issues. Stay on current dose of Prozac. Use Tramadol in the day, and use Vicodin at bedtime.  2)  Please schedule a follow-up  appointment in 3 months .  Prescriptions: TRAMADOL HCL 50 MG TABS (TRAMADOL HCL) 1-2 q 6 hours as needed for pain  #60 x 5   Entered and Authorized by:   Nelwyn Salisbury MD   Signed by:   Nelwyn Salisbury MD on 09/08/2009   Method used:   Print then Give to Patient   RxID:   [id number] VICODIN 5-500 MG TABS (HYDROCODONE-ACETAMINOPHEN) 1 q 6 hours as needed pain  #30 x 2   Entered and Authorized by:   Nelwyn Salisbury MD   Signed by:   Nelwyn Salisbury MD on 09/08/2009   Method used:   Print then Give to Patient   RxID:   [id number]

## 2010-03-10 NOTE — Assessment & Plan Note (Signed)
Summary: muscle pain/njr   Vital Signs:  Patient profile:   45 year old female Weight:      139 pounds Temp:     97.9 degrees F oral BP sitting:   98 / 60  (left arm) Cuff size:   regular  Vitals Entered By: Raechel Ache, RN (August 25, 2009 11:03 AM) CC: C/o body hurting all the time, stomach hurts, anxious and thirsty.   History of Present Illness: Here to discuss one year of worsening body aches and stress. She has been under a lot of stress with family issues, and it is wearing her down. She worries about her children constantly, and she feels overwhelmed at times. She is tearful a lot, and admits to some depression. Sleep is difficult. She stays tired all the time. She also describes aching pains in all her muscles, especially arms and legs. Her joints do not seem to be involved. She has tried 800 mg of Ibuprofen with no relief.   Allergies: No Known Drug Allergies  Past History:  Past Medical History: Reviewed history from 01/25/2007 and no changes required. Colonic polyps, hx of GERD genital wart, hx of X-chromosome linked syndrome causes skin pigmentation  Past Surgical History: Reviewed history from 01/25/2007 and no changes required. Sphincterotomy Bilateral breast reduction  Review of Systems  The patient denies anorexia, fever, weight loss, weight gain, vision loss, decreased hearing, hoarseness, chest pain, syncope, dyspnea on exertion, peripheral edema, prolonged cough, headaches, hemoptysis, abdominal pain, melena, hematochezia, severe indigestion/heartburn, hematuria, incontinence, genital sores, muscle weakness, suspicious skin lesions, transient blindness, difficulty walking, unusual weight change, abnormal bleeding, enlarged lymph nodes, angioedema, breast masses, and testicular masses.    Physical Exam  General:  Well-developed,well-nourished,in no acute distress; alert,appropriate and cooperative throughout examination Lungs:  Normal respiratory effort,  chest expands symmetrically. Lungs are clear to auscultation, no crackles or wheezes. Heart:  Normal rate and regular rhythm. S1 and S2 normal without gallop, murmur, click, rub or other extra sounds. Msk:  No deformity or scoliosis noted of thoracic or lumbar spine.   Neurologic:  alert & oriented X3 and gait normal.   Psych:  Oriented X3, memory intact for recent and remote, normally interactive, good eye contact, depressed affect, and tearful.     Impression & Recommendations:  Problem # 1:  DEPRESSION (ICD-311)  Her updated medication list for this problem includes:    Prozac 20 Mg Caps (Fluoxetine hcl) ..... Once daily  Problem # 2:  FIBROMYALGIA (ICD-729.1)  Her updated medication list for this problem includes:    Flexeril 10 Mg Tabs (Cyclobenzaprine hcl) .Marland Kitchen... Three times a day as needed spasm    Tramadol Hcl 50 Mg Tabs (Tramadol hcl) .Marland Kitchen... 1-2 q 6 hours as needed for pain  Complete Medication List: 1)  Furosemide 20 Mg Tabs (Furosemide) .... Take 1 tab by mouth every day 2)  Flexeril 10 Mg Tabs (Cyclobenzaprine hcl) .... Three times a day as needed spasm 3)  Promethazine Hcl 25 Mg Tabs (Promethazine hcl) .... 1/2 tablet every 6 hours as needed for nausea 4)  Omeprazole 40 Mg Cpdr (Omeprazole) .... Two times a day 5)  Tramadol Hcl 50 Mg Tabs (Tramadol hcl) .Marland Kitchen.. 1-2 q 6 hours as needed for pain 6)  Prozac 20 Mg Caps (Fluoxetine hcl) .... Once daily 7)  Temazepam 30 Mg Caps (Temazepam) .... At bedtime  Patient Instructions: 1)  This is consistent with fibromyalgia, and I told her that getting her depression under control is key to  her feeling better. Start on Prozac, use tramadol for pain, and get regular exercise.  2)  Please schedule a follow-up appointment in 2 weeks.  Prescriptions: OMEPRAZOLE 40 MG CPDR (OMEPRAZOLE) two times a day  #60 x 5   Entered and Authorized by:   Nelwyn Salisbury MD   Signed by:   Nelwyn Salisbury MD on 08/25/2009   Method used:   Print then Give to  Patient   RxID:   [id number] TEMAZEPAM 30 MG CAPS (TEMAZEPAM) at bedtime  #30 x 5   Entered and Authorized by:   Nelwyn Salisbury MD   Signed by:   Nelwyn Salisbury MD on 08/25/2009   Method used:   Print then Give to Patient   RxID:   [id number] PROZAC 20 MG CAPS (FLUOXETINE HCL) once daily  #30 x 2   Entered and Authorized by:   Nelwyn Salisbury MD   Signed by:   Nelwyn Salisbury MD on 08/25/2009   Method used:   Print then Give to Patient   RxID:   [id number] TRAMADOL HCL 50 MG TABS (TRAMADOL HCL) 1-2 q 6 hours as needed for pain  #60 x 2   Entered and Authorized by:   Nelwyn Salisbury MD   Signed by:   Nelwyn Salisbury MD on 08/25/2009   Method used:   Print then Give to Patient   RxID:   [id number] OMEPRAZOLE 40 MG CPDR (OMEPRAZOLE) once daily  #30 x 2   Entered and Authorized by:   Nelwyn Salisbury MD   Signed by:   Nelwyn Salisbury MD on 08/25/2009   Method used:   Print then Give to Patient   RxID:   [id number]

## 2010-03-12 NOTE — Progress Notes (Signed)
Summary: refill hydrocodone   [facility]  Phone Note From Pharmacy   [facility]  [place] [address] [place] Summary of Call: refill hydrocodone 10/650  Initial call taken by: Pura Spice, RN,  February 10, 2010 5:37 PM  Follow-up for Phone Call        call in #120 with 5 rf Follow-up by: Nelwyn Salisbury MD,  February 12, 2010 1:27 PM  Additional Follow-up for Phone Call Additional follow up Details #1::        called  Additional Follow-up by: Pura Spice, RN,  February 13, 2010 3:27 PM    Prescriptions: VICODIN HP 10-660 MG TABS (HYDROCODONE-ACETAMINOPHEN) 1 q 6 hours as needed pain  #120 x 5   Entered by:   Pura Spice, RN   Authorized by:   Nelwyn Salisbury MD   Signed by:   Pura Spice, RN on 02/13/2010   Method used:   Telephoned to ...       [facility]  [place] [address] (retail)       [address]       [place], [place]  [address]       Ph: [phone] or [phone]       Fax: [phone]   RxID:   [id number]

## 2010-03-12 NOTE — Progress Notes (Signed)
Summary: rx alprazolam   Phone Note From Pharmacy   [facility]  [address] [address] [place] Summary of Call: refill alprazolam 1 mg  Initial call taken by: Pura Spice, RN,  February 24, 2010 5:43 PM  Follow-up for Phone Call        take 1-2 at bedtime . call in #60 with 5 rf Follow-up by: Nelwyn Salisbury MD,  February 25, 2010 10:55 AM  Additional Follow-up for Phone Call Additional follow up Details #1::        called Additional Follow-up by: Pura Spice, RN,  February 25, 2010 1:56 PM    New/Updated Medications: ALPRAZOLAM 1 MG TABS (ALPRAZOLAM) take 1 -2 tablets at bedtime as needed Prescriptions: ALPRAZOLAM 1 MG TABS (ALPRAZOLAM) take 1 -2 tablets at bedtime as needed  #60 x 5   Entered by:   Pura Spice, RN   Authorized by:   Nelwyn Salisbury MD   Signed by:   Pura Spice, RN on 02/25/2010   Method used:   Telephoned to ...       [facility]  [place] [address] (retail)       [address]       [place], [place]  [address]       Ph: [phone] or [phone]       Fax: [phone]   RxID:   [id number]

## 2010-05-01 ENCOUNTER — Encounter: Payer: Self-pay | Admitting: Family Medicine

## 2010-05-01 ENCOUNTER — Ambulatory Visit (INDEPENDENT_AMBULATORY_CARE_PROVIDER_SITE_OTHER): Payer: Self-pay | Admitting: Family Medicine

## 2010-05-01 VITALS — BP 102/70 | HR 64 | Temp 98.7°F | Wt 134.0 lb

## 2010-05-01 DIAGNOSIS — IMO0001 Reserved for inherently not codable concepts without codable children: Secondary | ICD-10-CM

## 2010-05-01 DIAGNOSIS — F419 Anxiety disorder, unspecified: Secondary | ICD-10-CM

## 2010-05-01 DIAGNOSIS — F411 Generalized anxiety disorder: Secondary | ICD-10-CM

## 2010-05-01 DIAGNOSIS — M797 Fibromyalgia: Secondary | ICD-10-CM

## 2010-05-01 MED ORDER — GABAPENTIN 100 MG PO CAPS: 100.0000 mg | ORAL_CAPSULE | Freq: Three times a day (TID) | ORAL | Status: DC

## 2010-05-01 MED ORDER — HYDROCODONE-ACETAMINOPHEN 10-660 MG PO TABS: 1.0000 | ORAL_TABLET | ORAL | Status: DC | PRN

## 2010-05-01 NOTE — Progress Notes (Signed)
  Subjective:    Patient ID: Diane Gibbs, female    DOB: [birth year], 45 y.o.   MRN: [id number]  HPI Here to discuss her fibromyalgia which has been worse for the past 3 months. She feels stiff and sore in all her muscles every day, despite trying to exercise regularly. She is using her standard pain meds with only partial relief. She feels tired a lot and has trouble sleeping.    Review of Systems  Constitutional: Positive for fatigue.  Respiratory: Negative.   Cardiovascular: Negative.   Musculoskeletal: Positive for myalgias and arthralgias. Negative for back pain, joint swelling and gait problem.       Objective:   Physical Exam  Constitutional: She is oriented to person, place, and time. She appears well-developed and well-nourished.  Neck: No thyromegaly present.  Cardiovascular: Normal rate, regular rhythm, normal heart sounds and intact distal pulses.   Pulmonary/Chest: Effort normal and breath sounds normal.  Musculoskeletal: Normal range of motion.  Lymphadenopathy:    She has no cervical adenopathy.  Neurological: She is alert and oriented to person, place, and time. She exhibits normal muscle tone.  Psychiatric: She has a normal mood and affect. Her behavior is normal. Judgment and thought content normal.          Assessment & Plan:  We discussed alternative ways to treat her fibromylagia other than exercise and pain meds. She has never tried Neurontin or Lyrica, and she has not tried an antidepressant for quite a while. I suggested trying Cynmbalta, but she would like to hold off on this for the time being. We will start her on Neurontin, and then titrate up on the dose as tolerated.

## 2010-07-29 ENCOUNTER — Telehealth: Payer: Self-pay

## 2010-07-29 NOTE — Telephone Encounter (Signed)
HYDROCODONE-ACETAMINOPHEN 10-660 MG  LAST RX WRITTEN 05/01/2010 #180 WITH 2 REFILLS

## 2010-07-30 NOTE — Telephone Encounter (Signed)
NO, do not refill this. She is using way too much. She needs to see me to talk about this

## 2010-07-31 NOTE — Telephone Encounter (Signed)
Pt aware of this and appt has been made.

## 2010-08-03 ENCOUNTER — Other Ambulatory Visit: Payer: Self-pay

## 2010-08-03 NOTE — Telephone Encounter (Signed)
rx request for hydrocodone-acet 10-660 #180----pls advise

## 2010-08-04 ENCOUNTER — Ambulatory Visit (INDEPENDENT_AMBULATORY_CARE_PROVIDER_SITE_OTHER): Payer: Private Health Insurance - Indemnity | Admitting: Family Medicine

## 2010-08-04 ENCOUNTER — Encounter: Payer: Self-pay | Admitting: Family Medicine

## 2010-08-04 VITALS — BP 90/60 | HR 69 | Temp 97.9°F | Resp 14 | Wt 131.0 lb

## 2010-08-04 DIAGNOSIS — IMO0001 Reserved for inherently not codable concepts without codable children: Secondary | ICD-10-CM

## 2010-08-04 DIAGNOSIS — M797 Fibromyalgia: Secondary | ICD-10-CM

## 2010-08-04 MED ORDER — HYDROCODONE-ACETAMINOPHEN 10-660 MG PO TABS: 1.0000 | ORAL_TABLET | ORAL | Status: DC | PRN

## 2010-08-04 MED ORDER — GABAPENTIN 300 MG PO CAPS: 300.0000 mg | ORAL_CAPSULE | Freq: Three times a day (TID) | ORAL | Status: DC

## 2010-08-04 NOTE — Progress Notes (Signed)
  Subjective:    Patient ID: Diane Gibbs, female    DOB: 11/18/1965, 45 y.o.   MRN: [id number]  HPI Here to follow up on fibromyalgia. Now that she is on Gabapentin, she is doing much better. She has more continuous pain relief now, and is able to sleep through the nights with fewer awakenings.    Review of Systems  Constitutional: Negative.   Musculoskeletal: Positive for myalgias.       Objective:   Physical Exam  Constitutional: She appears well-developed and well-nourished.  Cardiovascular: Normal rate, regular rhythm, normal heart sounds and intact distal pulses.   Pulmonary/Chest: Effort normal and breath sounds normal.  Musculoskeletal: Normal range of motion. She exhibits no edema and no tenderness.          Assessment & Plan:  Increase Gabapentin to 300 mg tid.

## 2010-08-04 NOTE — Telephone Encounter (Signed)
Call in #180 with 2 rf 

## 2010-08-04 NOTE — Telephone Encounter (Signed)
Pt has OV today-will take care of then per Dr Clent Ridges.

## 2010-08-05 ENCOUNTER — Telehealth: Payer: Self-pay | Admitting: *Deleted

## 2010-08-05 NOTE — Telephone Encounter (Signed)
Refill hydrocodone 10/660 1 po every 6 hours prn for pain

## 2010-08-06 NOTE — Telephone Encounter (Signed)
NO, I just gave her a 6 month supply the day she was here (08-04-10)

## 2010-08-06 NOTE — Telephone Encounter (Signed)
Faxed request back to pharmacy 

## 2010-08-25 ENCOUNTER — Other Ambulatory Visit: Payer: Self-pay

## 2010-08-25 NOTE — Telephone Encounter (Signed)
Last filled 07/27/2010. Last OV/follow-up 08/04/2010

## 2010-08-26 MED ORDER — ALPRAZOLAM 1 MG PO TABS: ORAL_TABLET | ORAL | Status: DC

## 2010-08-26 NOTE — Telephone Encounter (Signed)
Call in #60 with 5 rf 

## 2010-09-16 ENCOUNTER — Inpatient Hospital Stay (HOSPITAL_COMMUNITY)
Admission: EM | Admit: 2010-09-16 | Discharge: 2010-09-17 | DRG: 392 | Disposition: A | Discharge status: Home / Self Care | Payer: Managed Care, Other (non HMO) | Attending: Internal Medicine | Admitting: Internal Medicine

## 2010-09-16 ENCOUNTER — Telehealth: Payer: Self-pay | Admitting: *Deleted

## 2010-09-16 DIAGNOSIS — M7989 Other specified soft tissue disorders: Secondary | ICD-10-CM

## 2010-09-16 DIAGNOSIS — K912 Postsurgical malabsorption, not elsewhere classified: Principal | ICD-10-CM | POA: Diagnosis present

## 2010-09-16 DIAGNOSIS — L659 Nonscarring hair loss, unspecified: Secondary | ICD-10-CM | POA: Diagnosis present

## 2010-09-16 DIAGNOSIS — D509 Iron deficiency anemia, unspecified: Secondary | ICD-10-CM | POA: Diagnosis present

## 2010-09-16 DIAGNOSIS — R5383 Other fatigue: Secondary | ICD-10-CM | POA: Diagnosis present

## 2010-09-16 DIAGNOSIS — IMO0001 Reserved for inherently not codable concepts without codable children: Secondary | ICD-10-CM | POA: Diagnosis present

## 2010-09-16 DIAGNOSIS — R5381 Other malaise: Secondary | ICD-10-CM | POA: Diagnosis present

## 2010-09-16 LAB — RETICULOCYTES: Retic Count, Absolute: 28.6 10*3/uL (ref 19.0–186.0)

## 2010-09-16 LAB — DIFFERENTIAL
Basophils Absolute: 0.1 10*3/uL (ref 0.0–0.1)
Eosinophils Absolute: 0.1 10*3/uL (ref 0.0–0.7)
Lymphs Abs: 2 10*3/uL (ref 0.7–4.0)

## 2010-09-16 LAB — BASIC METABOLIC PANEL
BUN: 8 mg/dL (ref 6–23)
CO2: 28 mEq/L (ref 19–32)
Chloride: 105 mEq/L (ref 96–112)
GFR calc non Af Amer: >60 mL/min (ref >60)
Glucose, Bld: 104 mg/dL — ABNORMAL HIGH (ref 70–99)
Potassium: 4.6 mEq/L (ref 3.5–5.1)
Sodium: 140 mEq/L (ref 135–145)

## 2010-09-16 LAB — CBC
Hemoglobin: 6.1 g/dL — CL (ref 12.0–15.0)
MCHC: 27.4 g/dL — ABNORMAL LOW (ref 30.0–36.0)
RDW: 19.4 % — ABNORMAL HIGH (ref 11.5–15.5)

## 2010-09-16 LAB — LACTATE DEHYDROGENASE: LDH: 222 U/L (ref 94–250)

## 2010-09-16 LAB — ABO/RH: ABO/RH(D): B POS

## 2010-09-16 NOTE — Telephone Encounter (Signed)
Pt is calling for appt ASAP with swollen right leg from knee to ankle and foot.  Unable to walk on it.  Also, has episodes of rapid heart rate several times daily with confusion and extreme thirst.

## 2010-09-16 NOTE — Telephone Encounter (Signed)
Spoke with pt and advised to go to ER, per Dr. Clent Ridges.

## 2010-09-17 LAB — CBC
Hemoglobin: 8.3 g/dL — ABNORMAL LOW (ref 12.0–15.0)
Platelets: 300 10*3/uL (ref 150–400)
RBC: 3.63 MIL/uL — ABNORMAL LOW (ref 3.87–5.11)
WBC: 4.3 10*3/uL (ref 4.0–10.5)

## 2010-09-17 LAB — IRON AND TIBC

## 2010-09-17 LAB — VITAMIN B12: Vitamin B-12: 454 pg/mL (ref 211–911)

## 2010-09-17 LAB — FERRITIN: Ferritin: 2 ng/mL — ABNORMAL LOW (ref 10–291)

## 2010-09-17 NOTE — Telephone Encounter (Signed)
Agreed -

## 2010-09-18 LAB — TYPE AND SCREEN: Unit division: 0

## 2010-09-18 NOTE — Discharge Summary (Signed)
Diane Gibbs, Diane Gibbs NO.:  [id number]  MEDICAL RECORD NO.:  [id number]  LOCATION:  4504                         FACILITY:  [facility]  PHYSICIAN:  Andreas Blower, MD       DATE OF BIRTH:  1965-08-21  DATE OF ADMISSION:  09/16/2010 DATE OF DISCHARGE:  09/17/2010                              DISCHARGE SUMMARY   PRIMARY CARE PHYSICIAN:  Jeannett Senior A. Clent Ridges, MD  DISCHARGE DIAGNOSES: 1. Severe anemia likely due to malabsorption from gastric bypass     surgery. 2. Status post gastric bypass surgery about 6 years ago. 3. Fibromyalgia. 4. Bilateral lower extremity swelling, likely due to severe anemia. 5. Status post C-section x2. 6. Status post breast reduction. 7. 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  DISCHARGE MEDICATIONS: 1. Multivitamin 1 tablet p.o. daily. 2. Nu-Iron 150 mg p.o. twice daily. 3. Alprazolam 1 mg p.o. daily at bedtime. 4. Gabapentin 300 mg p.o. 3 times daily. 5. Omeprazole 40 mg p.o. daily. 6. Vicodin 10/60 mg every 3 hours as needed for pain.  BRIEF ADMITTING HISTORY AND PHYSICAL:  Ms. Lartigue is a 45 year old Caucasian female with history of gastric bypass, who presents with weakness and heart racing.  RADIOLOGY/IMAGING:  The patient had right lower extremity Doppler on September 16, 2010, which showed right lower extremity negative for DVT or superficial thrombosis.  LABORATORY DATA:  CBC shows a white count of 4.3, hemoglobin 8.3, initial hemoglobin on presentation 6.1, hematocrit 27.8, platelet count 300.  D-dimer less than 0.22.  Electrolytes normal with a BUN of 8, creatinine 0.49.  Serum iron less than 10.  Vitamin B12 454.  Serum folate 19.4.  Fecal occult negative.  HOSPITAL COURSE BY PROBLEM: 1. Severe microcytic anemia.  The patient has received 2 units of     blood, suspect severe anemia from poor absorption from gastric     bypass the patient has had in the past.  The patient has received 2     units of blood.  Hemoglobin response was  appropriate.  Spoke with     Dr. Evette Cristal with Gastroenterology, who recommended that the patient     follow up as outpatient in his clinic.  The patient reported that     she has had a colonoscopy about 10 years ago for colon cancer     screening. 2. Bilateral lower extremity swelling likely due to severe anemia.     The patient had right lower extremity Dopplers, which were     negative.  D-dimer was less than 0.22 indicating low thromboembolic     event.  Suspect that as her anemia is corrected, her lower     extremity swelling should improve. 3. Fibromyalgia.  Continue home medications as outpatient. 4. Questionable alopecia maybe due to severe anemia and/or     malabsorption.  Recommended that the patient have TSH checked by     her primary care physician.  We started the patient on multivitamin     tablet.  DISPOSITION AND FOLLOWUP:  The patient to follow up with Dr. Evette Cristal with Gastroenterology in 1-2 weeks.  The patient to follow up with Dr. Clent Ridges, her primary care physician in 1 week.  Dr. Clent Ridges to consider  checking TSH as outpatient.  Time spent on discharge talking to the patient, family, and coordinating care was 35 minutes.   Andreas Blower, MD   SR/MEDQ  D:  09/17/2010  T:  09/18/2010  Job:  [id number]  Electronically Signed by Wardell Heath Poppi Scantling  on 09/18/2010 08:06:11 PM

## 2010-09-19 NOTE — H&P (Signed)
Diane Gibbs, Diane Gibbs NO.:  [id number]  MEDICAL RECORD NO.:  [id number]  LOCATION:  4504                         FACILITY:  [facility]  PHYSICIAN:  Tarry Kos, MD       DATE OF BIRTH:  1966-01-09  DATE OF ADMISSION:  09/16/2010 DATE OF DISCHARGE:                             HISTORY & PHYSICAL   CHIEF COMPLAINT:  Heart racing and weakness.  HISTORY OF PRESENT ILLNESS:  Diane Gibbs is a 45 year old female who is status post gastric bypass surgery over 5 years ago who presents to the emergency department really because her right lower extremity has been swelling off and on.  Really, both of her legs have been swelling, but her right was more than her left, particularly when she is on her feet for long period of time and it resolves when she elevates her leg. She came in because of that, but also had these other symptoms and she was found to be profoundly anemic.  She has been very weak and dizzy having on and off bilateral lower extremity swelling, worse on the right, just has not been feeling well.  She denies any melanotic stools or any bright red blood per rectum.  We are being asked to admit the patient and order for her to get a blood transfusion and GI workup. However, she is not taking any of her vitamins in over 5 years since her bypass surgery which is the most obvious reason for a profound anemia.  PAST MEDICAL HISTORY: 1. Fibromyalgia. 2. Again, status post gastric bypass surgery 5-6 years ago, has not     taken any vitamins in over 5 years.  She had a 103-pound weight     loss. 3,  Status post C-section x2. 1. Status post breast reduction.  FAMILY HISTORY:  Her father is currently battling colon cancer diagnosed at age of 44.  ALLERGIES:  None.  MEDICATIONS:  Percocet.  She does not really know which she takes.  SOCIAL HISTORY:  She does not smoke.  No alcohol.  No IV drug abuse.  PHYSICAL EXAMINATION:  VITAL SIGNS:  Temperature 99.3, blood  pressure 129/49, pulse 70, respirations 16, and 100% O2 sats on room air. GENERAL:  She is alert and oriented x4, in no apparent distress, cooperative and friendly. HEENT:  Extraocular muscles are intact.  Pupils equal and reactive to light.  Oropharynx is clear.  Mucous membranes are moist. NECK:  No JVD.  No carotid bruits. CARDIAC:  Regular rate and rhythm without murmurs, rubs, or gallops. CHEST:  Clear to auscultation bilaterally.  No wheezes, rhonchi, or rales. ABDOMEN:  Soft, nontender, and nondistended.  Positive bowel sounds.  No hepatosplenomegaly. PSYCH:  Normal mood and affect. NEURO:  No focal neurologic deficits. SKIN:  No rashes.  LABORATORY DATA:  Her Hemoccult is negative from the ED physician.  BUN and creatinine are normal.  Electrolytes are normal.  Hemoglobin is 6.1 with a MCV of 72.2.  Her RDW is high at 19.4.  Platelet count is normal. White count is normal.  D-dimer was negative.  Iron studies have been sent off by the ED.  ASSESSMENT/PLAN:  This is a 45 year old female with symptomatic anemia, microcytic,  likely secondary to previous gastric bypass surgery. Symptomatic anemia.  We will transfuse blood.  Iron studies have been send off.  This is likely due to vitamin deficiencies secondary to her gastric bypass surgery in the past.  I do not think there is any further GI workup that is warranted except routine colon cancer screening due to her age.  Particularly in the setting of no melanotic stools, bright red blood per rectum, and Hemoccult negative stool, I will Hemoccult her stools several more times.  She probably will need to have outpatient followup with a GI physician or at least a primary care physician to monitor her hemoglobin over a longer period of time.  I have stressed the importance of her taking her vitamins particularly with her obviously no previous GI surgery.  She understands this.  The patient is full code.  Further recommendation  pending overall hospital course.          ______________________________ Tarry Kos, MD     RD/MEDQ  D:  09/16/2010  T:  09/17/2010  Job:  [id number]  Electronically Signed by Tarry Kos MD on 09/19/2010 05:44:47 PM

## 2010-09-22 ENCOUNTER — Encounter: Payer: Self-pay | Admitting: Family Medicine

## 2010-09-22 ENCOUNTER — Ambulatory Visit (INDEPENDENT_AMBULATORY_CARE_PROVIDER_SITE_OTHER): Payer: Managed Care, Other (non HMO) | Admitting: Family Medicine

## 2010-09-22 VITALS — BP 98/60 | HR 71 | Temp 98.6°F | Wt 135.0 lb

## 2010-09-22 DIAGNOSIS — D649 Anemia, unspecified: Secondary | ICD-10-CM

## 2010-09-22 LAB — CBC WITH DIFFERENTIAL/PLATELET
Basophils Relative: 0.8 % (ref 0.0–3.0)
Eosinophils Relative: 0 % (ref 0.0–5.0)
HCT: 28.6 % — ABNORMAL LOW (ref 36.0–46.0)
Hemoglobin: 8.8 g/dL — ABNORMAL LOW (ref 12.0–15.0)
Lymphs Abs: 2.2 10*3/uL (ref 0.7–4.0)
MCV: 76.6 fl — ABNORMAL LOW (ref 78.0–100.0)
Monocytes Relative: 11.3 % (ref 3.0–12.0)
Neutro Abs: 2.2 10*3/uL (ref 1.4–7.7)
WBC: 5 10*3/uL (ref 4.5–10.5)

## 2010-09-22 MED ORDER — POLYSACCHARIDE IRON COMPLEX 150 MG PO CAPS: 150.0000 mg | ORAL_CAPSULE | Freq: Two times a day (BID) | ORAL | Status: DC

## 2010-09-22 NOTE — Progress Notes (Signed)
  Subjective:    Patient ID: Diane Gibbs, female    DOB: 26-Sep-1965, 45 y.o.   MRN: [id number]  HPI Here to follow up after a hospital stay from 09-16-10 to 09-17-10 for severe anemia. She had presented to the ER with fatigue and LE swelling. Her admission Hgb was 6.1, and after receiving 2 units of PRBCs her discharge Hgb went up to 8.3. Her iron level was extremely low, and the B12 and folate levels were normal. She was started on oral iron supplementation. She is due to see Dr. Wandalee Ferdinand for a GI evaluation soon. She feels somewhat better now but is very weak. No SOB or chest pain. No abdominal pain.    Review of Systems  Constitutional: Positive for fatigue.  Respiratory: Negative.   Cardiovascular: Positive for leg swelling. Negative for chest pain and palpitations.  Gastrointestinal: Negative.        Objective:   Physical Exam  Constitutional: She appears well-developed and well-nourished.  Neck: No thyromegaly present.  Cardiovascular: Normal rate, regular rhythm, normal heart sounds and intact distal pulses.   Pulmonary/Chest: Effort normal and breath sounds normal.  Abdominal: Soft. Bowel sounds are normal. She exhibits no distension and no mass. There is no tenderness. There is no rebound and no guarding.  Lymphadenopathy:    She has no cervical adenopathy.          Assessment & Plan:  Iron deficiency anemia. We will get a CBC and a TSH today. Continue oral iron. We will arrange for 2 IV infusions of Feraheme soon. See Dr. Evette Cristal.

## 2010-09-24 ENCOUNTER — Telehealth: Payer: Self-pay | Admitting: Family Medicine

## 2010-09-24 NOTE — Telephone Encounter (Signed)
Left voice message with results.

## 2010-09-28 ENCOUNTER — Telehealth: Payer: Self-pay | Admitting: *Deleted

## 2010-09-28 ENCOUNTER — Ambulatory Visit: Payer: Private Health Insurance - Indemnity | Admitting: Family Medicine

## 2010-09-28 NOTE — Telephone Encounter (Signed)
Pt was given lab results.  She did not get her voice mail from [facility].

## 2010-09-30 ENCOUNTER — Encounter (HOSPITAL_COMMUNITY): Payer: Managed Care, Other (non HMO) | Attending: Family Medicine

## 2010-09-30 DIAGNOSIS — D509 Iron deficiency anemia, unspecified: Secondary | ICD-10-CM | POA: Insufficient documentation

## 2010-10-07 ENCOUNTER — Encounter (HOSPITAL_COMMUNITY): Payer: Managed Care, Other (non HMO)

## 2010-10-07 ENCOUNTER — Telehealth: Payer: Self-pay | Admitting: *Deleted

## 2010-10-07 ENCOUNTER — Ambulatory Visit (INDEPENDENT_AMBULATORY_CARE_PROVIDER_SITE_OTHER): Payer: Managed Care, Other (non HMO) | Admitting: Family Medicine

## 2010-10-07 ENCOUNTER — Encounter: Payer: Self-pay | Admitting: Family Medicine

## 2010-10-07 VITALS — BP 100/64 | HR 119 | Temp 99.1°F | Wt 132.0 lb

## 2010-10-07 DIAGNOSIS — D649 Anemia, unspecified: Secondary | ICD-10-CM

## 2010-10-07 DIAGNOSIS — J329 Chronic sinusitis, unspecified: Secondary | ICD-10-CM

## 2010-10-07 MED ORDER — AZITHROMYCIN 250 MG PO TABS: ORAL_TABLET | ORAL | Status: AC

## 2010-10-07 NOTE — Progress Notes (Signed)
  Subjective:    Patient ID: Diane Gibbs, female    DOB: [birth year], 45 y.o.   MRN: [id number]  HPI Here for several days of extreme fatigue and some sinus symptoms. She has had a mild HA, sinus pressure, blowing yellow mucus from the nose, PND, and a mild ST. No fever or cough. No NVD or abdominal pains. She has been severely anemic, and she was in the hospital a few weeks ago with a Hgb of 6. She was transfused 2 units of PRBC, and then she was set up for 2 Feraheme iron infusions. Sjhe got the first one a week ago and the second one this morning. She needs to be evaluated for possible GI blood losses, and she is scheduled to see Dr. Wandalee Ferdinand on 10-13-10.    Review of Systems  Constitutional: Positive for fatigue.  HENT: Positive for congestion, sore throat, postnasal drip and sinus pressure.   Eyes: Negative.   Respiratory: Negative.   Cardiovascular: Negative.   Gastrointestinal: Negative.   Neurological: Positive for weakness.       Objective:   Physical Exam  Constitutional:       Alert but quite weak . Good skin color   HENT:  Right Ear: External ear normal.  Left Ear: External ear normal.  Nose: Nose normal.  Mouth/Throat: Oropharynx is clear and moist. No oropharyngeal exudate.  Eyes: Conjunctivae and EOM are normal. Pupils are equal, round, and reactive to light.  Neck: No thyromegaly present.  Cardiovascular: Normal rate, regular rhythm, normal heart sounds and intact distal pulses.   Pulmonary/Chest: Breath sounds normal. No respiratory distress. She has no wheezes. She has no rales. She exhibits no tenderness.  Abdominal: Soft. Bowel sounds are normal. She exhibits no distension and no mass. There is no tenderness. There is no rebound and no guarding.  Lymphadenopathy:    She has no cervical adenopathy.          Assessment & Plan:  Her Hgb today is 10.1, so she is responding well to  the iron infusions. She has an early URI so we will cover with a Zpack.  Rest, drink fluids, use Tylenol prn. See Dr. Evette Cristal as scheduled.

## 2010-10-07 NOTE — Telephone Encounter (Signed)
They are wanting to know if pt can still get the iron infusion. Pt's BP 93/52, normal HR, temp 97.0. Pt states that she feels tired and weak.  Per Dr. Clent Ridges- okay to get infusion.

## 2010-10-07 NOTE — Telephone Encounter (Signed)
As above. She is actually here to be seen this afternoon.

## 2010-10-16 ENCOUNTER — Ambulatory Visit (INDEPENDENT_AMBULATORY_CARE_PROVIDER_SITE_OTHER): Payer: Managed Care, Other (non HMO) | Admitting: Family Medicine

## 2010-10-16 ENCOUNTER — Encounter: Payer: Self-pay | Admitting: Family Medicine

## 2010-10-16 VITALS — BP 102/64 | HR 97 | Temp 98.9°F | Wt 133.0 lb

## 2010-10-16 DIAGNOSIS — D649 Anemia, unspecified: Secondary | ICD-10-CM

## 2010-10-16 DIAGNOSIS — R509 Fever, unspecified: Secondary | ICD-10-CM

## 2010-10-16 LAB — CBC WITH DIFFERENTIAL/PLATELET
Basophils Relative: 0.4 % (ref 0.0–3.0)
Eosinophils Absolute: 0 10*3/uL (ref 0.0–0.7)
HCT: 34.6 % — ABNORMAL LOW (ref 36.0–46.0)
Hemoglobin: 11 g/dL — ABNORMAL LOW (ref 12.0–15.0)
Lymphocytes Relative: 45.7 % (ref 12.0–46.0)
Lymphs Abs: 2.3 10*3/uL (ref 0.7–4.0)
MCHC: 31.9 g/dL (ref 30.0–36.0)
MCV: 84.8 fl (ref 78.0–100.0)
Neutro Abs: 2.2 10*3/uL (ref 1.4–7.7)
RBC: 4.07 Mil/uL (ref 3.87–5.11)

## 2010-10-16 LAB — POCT URINALYSIS DIPSTICK
Ketones, UA: NEGATIVE
Leukocytes, UA: NEGATIVE
Protein, UA: NEGATIVE
Urobilinogen, UA: 0.2

## 2010-10-16 LAB — BASIC METABOLIC PANEL
BUN: 10 mg/dL (ref 6–23)
Chloride: 105 mEq/L (ref 96–112)
Creatinine, Ser: 0.6 mg/dL (ref 0.4–1.2)

## 2010-10-16 LAB — HEPATIC FUNCTION PANEL
ALT: 26 U/L (ref 0–35)
Total Bilirubin: 0.4 mg/dL (ref 0.3–1.2)
Total Protein: 6.8 g/dL (ref 6.0–8.3)

## 2010-10-16 NOTE — Progress Notes (Signed)
  Subjective:    Patient ID: Diane Gibbs, female    DOB: 31-May-1965, 45 y.o.   MRN: [id number]  HPI Here for continued fatigue and 24 hours of a low grade fever. She saw Dr. Evette Cristal this week, and they will doing upper and lower endoscopy on Monday. Sh denies any other specific symptoms however.   Review of Systems  Constitutional: Positive for fever and fatigue.  HENT: Negative.   Eyes: Negative.   Respiratory: Negative.   Cardiovascular: Negative.   Gastrointestinal: Negative.   Genitourinary: Negative.   Neurological: Positive for weakness.       Objective:   Physical Exam  Constitutional: She is oriented to person, place, and time.       Lethargic but alert.   HENT:  Right Ear: External ear normal.  Left Ear: External ear normal.  Nose: Nose normal.  Mouth/Throat: Oropharynx is clear and moist. No oropharyngeal exudate.  Eyes: Conjunctivae and EOM are normal. Pupils are equal, round, and reactive to light. Right eye exhibits no discharge. Left eye exhibits no discharge. No scleral icterus.  Neck: Neck supple. No thyromegaly present.  Cardiovascular: Normal rate, regular rhythm, normal heart sounds and intact distal pulses.   Pulmonary/Chest: Effort normal and breath sounds normal.  Abdominal: Soft. Bowel sounds are normal. She exhibits no distension and no mass. There is no tenderness. There is no rebound and no guarding.  Lymphadenopathy:    She has no cervical adenopathy.  Neurological: She is alert and oriented to person, place, and time.  Skin: Skin is warm and dry. No rash noted. No erythema.          Assessment & Plan:  Fever of uncertain etiology in a patient with anemia. She will get her endoscopies as above to look for GI sources of bleeding. Check labs to day to look for possible viral etiologies. Rest , drink fluids

## 2010-10-19 LAB — CYTOMEGALOVIRUS ANTIBODY, IGG: Cytomegalovirus Ab-IgG: 0.05 (ref <0.90)

## 2010-10-19 LAB — CMV IGM: CMV IgM: 0.12 (ref <0.90)

## 2010-10-20 ENCOUNTER — Telehealth: Payer: Self-pay | Admitting: *Deleted

## 2010-10-20 NOTE — Telephone Encounter (Signed)
Stay on Nu Iron 150 mg bid for now. Call in one year supply

## 2010-10-20 NOTE — Telephone Encounter (Signed)
See the report. No need for Hematology right now because her cell counts are almost back to normal. See what the endoscopies show

## 2010-10-20 NOTE — Telephone Encounter (Signed)
Pt wants to know her lab results, and if we are sending her to a hematologist???

## 2010-10-20 NOTE — Telephone Encounter (Signed)
Pt states her new iron is about to run out Pt states she went for an endoscopy and it was normal. Pt states a biopsy was taken for celiac disease and it will be back in about 7 days.

## 2010-10-22 ENCOUNTER — Telehealth: Payer: Self-pay | Admitting: Family Medicine

## 2010-10-22 MED ORDER — POLYSACCHARIDE IRON COMPLEX 150 MG PO CAPS: 150.0000 mg | ORAL_CAPSULE | Freq: Two times a day (BID) | ORAL | Status: DC

## 2010-10-22 NOTE — Telephone Encounter (Signed)
Script sent e-scribe and pt aware. 

## 2010-10-22 NOTE — Telephone Encounter (Signed)
Spoke with pt and gave results. 

## 2010-10-22 NOTE — Telephone Encounter (Signed)
Message copied by Baldemar Friday on Thu Oct 22, 2010  2:49 PM ------      Message from: Gershon Crane A      Created: Tue Oct 20, 2010  5:32 AM       Her labs are normal except for mild anemia. Her Hgb is all the way up to 11 now. Still waiting on the viral tests to come back

## 2010-10-29 ENCOUNTER — Ambulatory Visit: Payer: Managed Care, Other (non HMO) | Admitting: Family Medicine

## 2010-11-06 ENCOUNTER — Encounter: Payer: Self-pay | Admitting: Family Medicine

## 2010-11-11 ENCOUNTER — Telehealth: Payer: Self-pay | Admitting: *Deleted

## 2010-11-11 DIAGNOSIS — D649 Anemia, unspecified: Secondary | ICD-10-CM

## 2010-11-11 NOTE — Telephone Encounter (Signed)
Pt is asking for lab results, and needs to schedule more labs.  Is still having symptoms of anemia.

## 2010-11-12 NOTE — Telephone Encounter (Signed)
We never got results for CMV or HIV. Please call the lab to get these

## 2010-11-12 NOTE — Telephone Encounter (Signed)
Lab will let Dr. Clent Ridges know status of labs.

## 2010-11-13 NOTE — Telephone Encounter (Signed)
I spoke with pt and gave results ( all viral tests were negative ). I put a future lab order in computer to check iron level in about 1 month and pt is aware.

## 2010-11-13 NOTE — Telephone Encounter (Signed)
Addended by: Aniceto Boss A on: 11/13/2010 09:33 AM   Modules accepted: Orders

## 2010-11-19 ENCOUNTER — Encounter: Payer: Self-pay | Admitting: Family Medicine

## 2010-11-19 LAB — CBC
HCT: 32.8 — ABNORMAL LOW
MCHC: 33.9
MCV: 91.9
Platelets: 291
RDW: 11.8

## 2010-11-19 LAB — DIFFERENTIAL
Basophils Absolute: 0
Eosinophils Absolute: 0.2
Eosinophils Relative: 2

## 2010-11-19 LAB — POCT CARDIAC MARKERS
CKMB, poc: <1 — ABNORMAL LOW
Myoglobin, poc: 14.8

## 2010-11-19 LAB — LIPASE, BLOOD: Lipase: 20

## 2010-12-22 ENCOUNTER — Telehealth: Payer: Self-pay | Admitting: *Deleted

## 2010-12-22 NOTE — Telephone Encounter (Signed)
I can see her tomorrow morning

## 2010-12-22 NOTE — Telephone Encounter (Signed)
Flu symptoms

## 2010-12-23 ENCOUNTER — Ambulatory Visit: Payer: Managed Care, Other (non HMO) | Admitting: Family Medicine

## 2010-12-23 NOTE — Telephone Encounter (Signed)
Pt feels much better today.

## 2010-12-23 NOTE — Telephone Encounter (Signed)
Pt's husband had flu yesterday, and Diane Gibbs does NOT have symptoms.  Does not need to come in.

## 2011-01-04 ENCOUNTER — Telehealth: Payer: Self-pay | Admitting: Family Medicine

## 2011-01-04 NOTE — Telephone Encounter (Signed)
Refill request for Hydrocodone/Acetaminophn 10-660 mg take 1 po q4-6hrs prn and pt last here on 10/16/10.

## 2011-01-05 MED ORDER — HYDROCODONE-ACETAMINOPHEN 10-660 MG PO TABS: ORAL_TABLET | ORAL | Status: DC

## 2011-01-05 NOTE — Telephone Encounter (Signed)
rx sent to pharmacy

## 2011-01-05 NOTE — Telephone Encounter (Signed)
Call in #180 with 5 rf 

## 2011-02-19 ENCOUNTER — Ambulatory Visit (INDEPENDENT_AMBULATORY_CARE_PROVIDER_SITE_OTHER): Payer: Managed Care, Other (non HMO) | Admitting: Family Medicine

## 2011-02-19 ENCOUNTER — Encounter: Payer: Self-pay | Admitting: Family Medicine

## 2011-02-19 VITALS — BP 120/80 | HR 93 | Temp 98.6°F | Wt 148.0 lb

## 2011-02-19 DIAGNOSIS — IMO0001 Reserved for inherently not codable concepts without codable children: Secondary | ICD-10-CM

## 2011-02-19 DIAGNOSIS — H6120 Impacted cerumen, unspecified ear: Secondary | ICD-10-CM

## 2011-02-19 DIAGNOSIS — M797 Fibromyalgia: Secondary | ICD-10-CM

## 2011-02-19 MED ORDER — PREGABALIN 100 MG PO CAPS: 100.0000 mg | ORAL_CAPSULE | Freq: Two times a day (BID) | ORAL | Status: DC

## 2011-02-19 NOTE — Progress Notes (Signed)
  Subjective:    Patient ID: Diane Gibbs, female    DOB: 1965-12-18, 47 y.o.   MRN: [id number]  HPI Here for one week of decreased hearing and pain in the right ear. No URI or sinus symptoms. Also her fibromyalgia is getting worse. She had to stop taking Neurontin because it was too sedating for her. She is having to take 6-7 Vicodins a day to control her pain.    Review of Systems  Constitutional: Negative.   HENT: Positive for hearing loss and ear pain. Negative for congestion, neck pain, postnasal drip and sinus pressure.   Eyes: Negative.   Respiratory: Negative.   Musculoskeletal: Positive for myalgias.       Objective:   Physical Exam  Constitutional: She is oriented to person, place, and time. She appears well-developed and well-nourished.  HENT:  Left Ear: External ear normal.  Nose: Nose normal.  Mouth/Throat: No oropharyngeal exudate.       The right ear is occluded by cerumen   Eyes: Conjunctivae are normal. Pupils are equal, round, and reactive to light. No scleral icterus.  Neurological: She is alert and oriented to person, place, and time.          Assessment & Plan:  The ear was irrigated clear with water. We will try Lyrica for the fibromyalgia

## 2011-03-16 ENCOUNTER — Telehealth: Payer: Self-pay | Admitting: Family Medicine

## 2011-03-16 MED ORDER — CYCLOBENZAPRINE HCL 10 MG PO TABS: 10.0000 mg | ORAL_TABLET | Freq: Three times a day (TID) | ORAL | Status: DC | PRN

## 2011-03-16 NOTE — Telephone Encounter (Signed)
Script sent e-scribe 

## 2011-03-26 ENCOUNTER — Telehealth: Payer: Self-pay | Admitting: Family Medicine

## 2011-03-26 NOTE — Telephone Encounter (Signed)
Refill request for Alprazolam 1 mg take 1-2 po qhs and pt last here on 02/09/11.

## 2011-03-29 MED ORDER — ALPRAZOLAM 1 MG PO TABS: 1.0000 mg | ORAL_TABLET | Freq: Every evening | ORAL | Status: DC | PRN

## 2011-03-29 NOTE — Telephone Encounter (Signed)
Call in #60 with 5 rf 

## 2011-03-29 NOTE — Telephone Encounter (Signed)
Script called in

## 2011-07-06 ENCOUNTER — Telehealth: Payer: Self-pay | Admitting: Family Medicine

## 2011-07-06 NOTE — Telephone Encounter (Signed)
Refill request for Vicodin 10/660 mg take 1 po q4-6 hrs prn and pt last here on 02/19/11.

## 2011-07-07 MED ORDER — HYDROCODONE-ACETAMINOPHEN 10-660 MG PO TABS: 1.0000 | ORAL_TABLET | ORAL | Status: DC | PRN

## 2011-07-07 NOTE — Telephone Encounter (Signed)
Call in #180 with 5 rf 

## 2011-07-07 NOTE — Telephone Encounter (Signed)
Script called in

## 2011-07-10 ENCOUNTER — Other Ambulatory Visit: Payer: Self-pay | Admitting: Family Medicine

## 2011-08-04 ENCOUNTER — Telehealth: Payer: Self-pay | Admitting: Family Medicine

## 2011-08-04 MED ORDER — PREGABALIN 100 MG PO CAPS: 100.0000 mg | ORAL_CAPSULE | Freq: Two times a day (BID) | ORAL | Status: DC

## 2011-08-04 NOTE — Telephone Encounter (Signed)
I called in script for Lyrica.

## 2011-09-23 ENCOUNTER — Encounter: Payer: Self-pay | Admitting: Family Medicine

## 2011-09-23 ENCOUNTER — Ambulatory Visit (INDEPENDENT_AMBULATORY_CARE_PROVIDER_SITE_OTHER): Payer: Managed Care, Other (non HMO) | Admitting: Family Medicine

## 2011-09-23 VITALS — BP 100/72 | Temp 97.7°F | Wt 163.0 lb

## 2011-09-23 DIAGNOSIS — F411 Generalized anxiety disorder: Secondary | ICD-10-CM

## 2011-09-23 DIAGNOSIS — R635 Abnormal weight gain: Secondary | ICD-10-CM

## 2011-09-23 DIAGNOSIS — F419 Anxiety disorder, unspecified: Secondary | ICD-10-CM

## 2011-09-23 MED ORDER — ESCITALOPRAM OXALATE 10 MG PO TABS: 10.0000 mg | ORAL_TABLET | Freq: Every day | ORAL | Status: DC

## 2011-09-23 MED ORDER — ALPRAZOLAM 1 MG PO TABS: 1.0000 mg | ORAL_TABLET | Freq: Every evening | ORAL | Status: AC | PRN

## 2011-09-23 NOTE — Progress Notes (Signed)
  Subjective:    Patient ID: Diane Gibbs, female    DOB: 1965-11-01, 46 y.o.   MRN: [id number]  HPI Here to discuss high stress levels which have contributed to a lot of weight gain. She has gained about 25 lbs in the past year, and she admits this is due to overeating and no exercise. She has been very stressed for a number of reasons. First of all her husband is gone most of the time on his job, leaving her alone to take care of the house, the yard, and everything else. Also she was fired from her job 2 months ago for reporting a coworker who had been harrassing her. 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 She has been at a new job for 2 weeks now but she is under a lot of pressure to be productive. She takes her youngest daughter to college tomorrow, and after that she will be all alone in her house. She is tearful and feels overwhelmed. She cannot relax and she snacks all the time. She sleeps by using Xanax at bedtime.    Review of Systems  Constitutional: Positive for appetite change and unexpected weight change. Negative for activity change.  Psychiatric/Behavioral: Positive for dysphoric mood and decreased concentration. Negative for behavioral problems, confusion and agitation. The patient is nervous/anxious.        Objective:   Physical Exam  Constitutional: She appears well-developed and well-nourished.  Psychiatric: Her behavior is normal. Judgment and thought content normal.       Anxious, tearful           Assessment & Plan:  She is clearly struggling with anxiety, so we will start her on Lexapro 10 mg a day. I urged her to exercise 30-45 minutes at least 5 days a week. Recheck in 3 weeks

## 2011-10-20 ENCOUNTER — Encounter: Payer: Self-pay | Admitting: Family Medicine

## 2011-10-20 ENCOUNTER — Ambulatory Visit (INDEPENDENT_AMBULATORY_CARE_PROVIDER_SITE_OTHER): Payer: Managed Care, Other (non HMO) | Admitting: Family Medicine

## 2011-10-20 VITALS — BP 112/70 | HR 106 | Temp 98.6°F | Wt 156.0 lb

## 2011-10-20 DIAGNOSIS — F419 Anxiety disorder, unspecified: Secondary | ICD-10-CM

## 2011-10-20 DIAGNOSIS — F411 Generalized anxiety disorder: Secondary | ICD-10-CM

## 2011-10-20 DIAGNOSIS — L259 Unspecified contact dermatitis, unspecified cause: Secondary | ICD-10-CM

## 2011-10-20 MED ORDER — METHYLPREDNISOLONE ACETATE 80 MG/ML IJ SUSP
120.0000 mg | Freq: Once | INTRAMUSCULAR | Status: AC
  Administered 2011-10-20: 120 mg via INTRAMUSCULAR

## 2011-10-20 NOTE — Progress Notes (Signed)
  Subjective:    Patient ID: Diane Gibbs, female    DOB: 12/11/1965, 46 y.o.   MRN: [id number]  HPI Here to follow up on anxiety and weight gain, and also for 3 days of an itchy rash on the face and arms and trunk. She had been working in the yard last weekend. She has been taking Lexapro for the past month for anxiety, and she is very pleased with the results. She feels more relaxed and more in control of her life now. She is sleeping well.    Review of Systems  Constitutional: Negative.   Skin: Positive for rash.  Psychiatric/Behavioral: Negative.        Objective:   Physical Exam  Constitutional: She appears well-developed and well-nourished.       She has lost 7 lbs since her last visit   Skin:       Patches of red papules and vesicles as above, especially around the right eye and right forehead   Psychiatric: She has a normal mood and affect. Her behavior is normal. Thought content normal.          Assessment & Plan:  Her anxiety is much improved. Stay on Lexapro. For the dermatitis, given a steroid shot and add Zyrtec 10 mg bid for itching and OTC cortisone creams

## 2011-10-21 ENCOUNTER — Telehealth: Payer: Self-pay | Admitting: Family Medicine

## 2011-10-21 ENCOUNTER — Encounter: Payer: Self-pay | Admitting: Internal Medicine

## 2011-10-21 ENCOUNTER — Ambulatory Visit (INDEPENDENT_AMBULATORY_CARE_PROVIDER_SITE_OTHER): Payer: Managed Care, Other (non HMO) | Admitting: Internal Medicine

## 2011-10-21 VITALS — BP 130/84

## 2011-10-21 DIAGNOSIS — L259 Unspecified contact dermatitis, unspecified cause: Secondary | ICD-10-CM

## 2011-10-21 DIAGNOSIS — L255 Unspecified contact dermatitis due to plants, except food: Secondary | ICD-10-CM

## 2011-10-21 MED ORDER — PREDNISONE 10 MG PO TABS: ORAL_TABLET | ORAL | Status: DC

## 2011-10-21 NOTE — Progress Notes (Signed)
  Subjective:    Patient ID: Diane Gibbs, female    DOB: 19-Apr-1965, 46 y.o.   MRN: [id number]  HPI  46 year old patient who is seen today in followup. She was treated yesterday for a contact dermatitis that began the day prior after working out in the yard a few days before. She feels that she has worsened with some worsening facial swelling and itching rashes predominantly facial but does have some scattered lesion over the arms she was treated with Depo-Medrol 80 yesterday    Review of Systems  Skin: Positive for rash.       Objective:   Physical Exam  Constitutional: She appears well-developed and well-nourished. No distress.  Skin:       Scattered erythematous papular lesions over the arms and legs More confluent scattered erythema and soft tissue swelling involving the facial area Several areas open and oozing          Assessment & Plan:   Contact dermatitis. The patient will continue topical therapy as well as oral antihistamines. She will be started also on a 12 day 10 mg prednisone Dosepak

## 2011-10-21 NOTE — Patient Instructions (Addendum)
Prednisone dose pack as discussedPoison Ivy Poison ivy is a inflammation of the skin (contact dermatitis) caused by touching the allergens on the leaves of the ivy plant following previous exposure to the plant. The rash usually appears 48 hours after exposure. The rash is usually bumps (papules) or blisters (vesicles) in a linear pattern. Depending on your own sensitivity, the rash may simply cause redness and itching, or it may also progress to blisters which may break open. These must be well cared for to prevent secondary bacterial (germ) infection, followed by scarring. Keep any open areas dry, clean, dressed, and covered with an antibacterial ointment if needed. The eyes may also get puffy. The puffiness is worst in the morning and gets better as the day progresses. This dermatitis usually heals without scarring, within 2 to 3 weeks without treatment. HOME CARE INSTRUCTIONS   Thoroughly wash with soap and water as soon as you have been exposed to poison ivy. You have about one half hour to remove the plant resin before it will cause the rash. This washing will destroy the oil or antigen on the skin that is causing, or will cause, the rash. Be sure to wash under your fingernails as any plant resin there will continue to spread the rash. Do not rub skin vigorously when washing affected area. Poison ivy cannot spread if no oil from the plant remains on your body. A rash that has progressed to weeping sores will not spread the rash unless you have not washed thoroughly. It is also important to wash any clothes you have been wearing as these may carry active allergens. The rash will return if you wear the unwashed clothing, even several days later. Avoidance of the plant in the future is the best measure. Poison ivy plant can be recognized by the number of leaves. Generally, poison ivy has three leaves with flowering branches on a single stem. Diphenhydramine may be purchased over the counter and used as  needed for itching. Do not drive with this medication if it makes you drowsy.Ask your caregiver about medication for children. SEEK MEDICAL CARE IF:  Open sores develop.   Redness spreads beyond area of rash.   You notice purulent (pus-like) discharge.   You have increased pain.   Other signs of infection develop (such as fever).  Document Released: 01/23/2000 Document Revised: 01/14/2011 Document Reviewed: 12/11/2008 [facility] Patient Information 2012 [place], [place].

## 2011-10-21 NOTE — Telephone Encounter (Signed)
Caller: [name]/Patient; Patient Name: Diane Gibbs; PCP: Gershon Crane [facility]); Best Callback Phone Number: [phone]; Reason for call: Poison Ivy.  Onset 10/19/11. Pt seen in office 10/20/11 and was treated.  Pt says the rash has spread on the right side of her neck and jaw line  Her right eye is swollen.  Pt has taken four Benadryl today and is not gettng any relief. Urgent symptoms present due to 'Involves eyes, mouth, or genitals' per Poison, [place] or [place] Exposure protocol.  See Provider in 4 hrs. Pt declined appt.  Says she doesn't think she could drive and she is in bed.   Home care advice given.  PLEASE FOLLOW UP WITH PT IN REGARD TO POISON IVY, PT SEEN 10/20/11.  Thank you.

## 2011-10-22 NOTE — Telephone Encounter (Signed)
I spoke with pt and she was seen here by Dr. Kirtland Bouchard on 10/21/11.

## 2011-10-22 NOTE — Telephone Encounter (Signed)
She is already on a taper of oral steroids. Continue Benadryl. Recheck prn

## 2011-12-10 ENCOUNTER — Other Ambulatory Visit: Payer: Self-pay | Admitting: Family Medicine

## 2011-12-17 ENCOUNTER — Other Ambulatory Visit: Payer: Self-pay | Admitting: Family Medicine

## 2011-12-17 NOTE — Telephone Encounter (Signed)
Call in #180 with 5 rf 

## 2012-02-14 ENCOUNTER — Encounter: Payer: Self-pay | Admitting: Family Medicine

## 2012-02-14 ENCOUNTER — Ambulatory Visit (INDEPENDENT_AMBULATORY_CARE_PROVIDER_SITE_OTHER): Payer: Managed Care, Other (non HMO) | Admitting: Family Medicine

## 2012-02-14 VITALS — BP 126/78 | HR 90 | Temp 98.6°F | Wt 168.0 lb

## 2012-02-14 DIAGNOSIS — B070 Plantar wart: Secondary | ICD-10-CM

## 2012-02-14 DIAGNOSIS — F909 Attention-deficit hyperactivity disorder, unspecified type: Secondary | ICD-10-CM

## 2012-02-14 MED ORDER — AMPHETAMINE-DEXTROAMPHET ER 10 MG PO CP24: 10.0000 mg | ORAL_CAPSULE | ORAL | Status: DC

## 2012-02-14 NOTE — Progress Notes (Signed)
  Subjective:    Patient ID: Diane Gibbs, female    DOB: 06/07/1965, 47 y.o.   MRN: [id number]  HPI Here to treat a painful wart on the bottom of her right foot that appeared over a year ago. Also she wants to try Adderall for her attention and focus problems. We have discussed this at past visits.    Review of Systems  Constitutional: Negative.   Neurological: Negative.   Psychiatric/Behavioral: Negative.        Objective:   Physical Exam  Constitutional: She is oriented to person, place, and time. She appears well-developed and well-nourished.  Neurological: She is alert and oriented to person, place, and time.  Skin:       There is a tender flat wart on the ball of the right foot   Psychiatric: She has a normal mood and affect. Her behavior is normal. Thought content normal.          Assessment & Plan:  The wart was treated with cryosurgery. Try Adderall XR.

## 2012-02-27 ENCOUNTER — Other Ambulatory Visit: Payer: Self-pay | Admitting: Family Medicine

## 2012-02-28 NOTE — Telephone Encounter (Signed)
Refill for a year 

## 2012-03-03 ENCOUNTER — Other Ambulatory Visit: Payer: Self-pay | Admitting: Family Medicine

## 2012-03-03 NOTE — Telephone Encounter (Signed)
Cancel the Vicodin 10/660 and change to 10/325 to take q 4 hour prn pain, call in #180 with 5 rf

## 2012-03-03 NOTE — Telephone Encounter (Signed)
Pharm states they have run out of Hydrocodone-Acetaminophen 10-660 MG TABS   And need new script for pt's med. Pt is concerned the new script will have the same active ingredients as the old.  Pt also is going out of town in the early am and needs this script today please!

## 2012-03-06 ENCOUNTER — Telehealth: Payer: Self-pay | Admitting: Family Medicine

## 2012-03-06 NOTE — Telephone Encounter (Signed)
[facility] Triage Call Report Triage Record Num: [id number] Operator: Dustin Flock [place] Patient Name: Diane Gibbs Call Date & Time: 03/03/2012 8:53:11PM Patient Phone: [phone] PCP: Tera Mater. Clent Ridges Patient Gender: Female PCP Fax : [phone] Patient DOB: [birth year] [facility] Name: [facility] Reason for Call: Caller: [name]/Patient; PCP: Gershon Crane (Family Practice); CB#: [phone]; Call regarding Medication Issue; Medication(s): Hydrocodine; Caller states pain med order was not faxed to pharmacy. Nelwyn Salisbury, MD 03/03/2012 3:43 PM Signed, Cancel the Vicodin 10/660 and change to 10/325 to take q 4 hour prn pain, call in #180 with 5 rf [name] 03/03/2012 1:05 PM Signed. Called this order in to [facility] [phone]. Pharm states they have run out of Hydrocodone-Acetaminophen 10-660 MG TABS Protocol(s) Used: Office Note Recommended Outcome per Protocol: Information Noted and Sent to Office Reason for Outcome: Caller information to office Care Advice: ~ 01/24/

## 2012-03-07 MED ORDER — HYDROCODONE-ACETAMINOPHEN 10-325 MG PO TABS: 1.0000 | ORAL_TABLET | ORAL | Status: DC | PRN

## 2012-03-07 NOTE — Telephone Encounter (Signed)
I called in script 

## 2012-03-07 NOTE — Addendum Note (Signed)
Addended by: Aniceto Boss A on: 03/07/2012 01:27 PM   Modules accepted: Orders

## 2012-03-15 ENCOUNTER — Telehealth: Payer: Self-pay | Admitting: Family Medicine

## 2012-03-15 MED ORDER — AMPHETAMINE-DEXTROAMPHET ER 10 MG PO CP24: 10.0000 mg | ORAL_CAPSULE | ORAL | Status: DC

## 2012-03-15 NOTE — Telephone Encounter (Signed)
done

## 2012-03-15 NOTE — Telephone Encounter (Signed)
Patient states that the amphetamine-dextroamphetamine (ADDERALL XR) 10 MG 24 hr capsule is working well. She would like another refill. Please call when ready for pick up.

## 2012-03-16 NOTE — Telephone Encounter (Signed)
Script is ready for pick up and I spoke with pt.  

## 2012-04-04 ENCOUNTER — Other Ambulatory Visit: Payer: Self-pay | Admitting: Family Medicine

## 2012-04-04 NOTE — Telephone Encounter (Signed)
Call in Xanax #60 with 5 rf, also Flexeril #60 with 11 rf

## 2012-04-10 ENCOUNTER — Ambulatory Visit: Payer: Managed Care, Other (non HMO) | Admitting: Family Medicine

## 2012-04-11 ENCOUNTER — Encounter: Payer: Self-pay | Admitting: Family Medicine

## 2012-04-11 ENCOUNTER — Ambulatory Visit (INDEPENDENT_AMBULATORY_CARE_PROVIDER_SITE_OTHER): Payer: Managed Care, Other (non HMO) | Admitting: Family Medicine

## 2012-04-11 VITALS — BP 122/80 | HR 96 | Temp 99.0°F | Wt 172.0 lb

## 2012-04-11 DIAGNOSIS — J029 Acute pharyngitis, unspecified: Secondary | ICD-10-CM

## 2012-04-11 MED ORDER — CEPHALEXIN 500 MG PO CAPS: 500.0000 mg | ORAL_CAPSULE | Freq: Three times a day (TID) | ORAL | Status: AC

## 2012-04-11 NOTE — Progress Notes (Signed)
  Subjective:    Patient ID: Diane Gibbs, female    DOB: 12/07/1965, 47 y.o.   MRN: [id number]  HPI Here for 5 days of a ST with low grade fevers. No sinus congestion or cough. On Nyquil and fluids.    Review of Systems  Constitutional: Positive for fever.  HENT: Positive for sore throat. Negative for congestion, postnasal drip and sinus pressure.   Eyes: Negative.   Respiratory: Negative.        Objective:   Physical Exam  Constitutional: She appears well-developed and well-nourished.  HENT:  Right Ear: External ear normal.  Left Ear: External ear normal.  Nose: Nose normal.  Mouth/Throat: No oropharyngeal exudate.  Posterior OP is red   Eyes: Conjunctivae are normal.  Neck: No thyromegaly present.  Pulmonary/Chest: Effort normal and breath sounds normal.  Lymphadenopathy:    She has no cervical adenopathy.          Assessment & Plan:  Add Motrin prn

## 2012-05-19 ENCOUNTER — Other Ambulatory Visit: Payer: Self-pay | Admitting: Family Medicine

## 2012-06-19 ENCOUNTER — Telehealth: Payer: Self-pay | Admitting: Family Medicine

## 2012-06-19 MED ORDER — CYCLOBENZAPRINE HCL 10 MG PO TABS: 10.0000 mg | ORAL_TABLET | Freq: Three times a day (TID) | ORAL | Status: DC | PRN

## 2012-06-19 MED ORDER — AMPHETAMINE-DEXTROAMPHET ER 10 MG PO CP24: 10.0000 mg | ORAL_CAPSULE | ORAL | Status: DC

## 2012-06-19 MED ORDER — ALPRAZOLAM 1 MG PO TABS: 2.0000 mg | ORAL_TABLET | Freq: Every evening | ORAL | Status: DC | PRN

## 2012-06-19 MED ORDER — OMEPRAZOLE 40 MG PO CPDR: DELAYED_RELEASE_CAPSULE | ORAL | Status: DC

## 2012-06-19 MED ORDER — HYDROCODONE-ACETAMINOPHEN 10-325 MG PO TABS: 1.0000 | ORAL_TABLET | ORAL | Status: DC | PRN

## 2012-06-19 MED ORDER — ESCITALOPRAM OXALATE 10 MG PO TABS: ORAL_TABLET | ORAL | Status: DC

## 2012-06-19 NOTE — Telephone Encounter (Signed)
Patient came in stating that she is moving out of town and would like refills of all of her meds so that they will last until she finds a physician in her new area. Please assist.

## 2012-06-19 NOTE — Telephone Encounter (Signed)
done

## 2012-06-20 NOTE — Telephone Encounter (Signed)
Scripts are ready for pick up and I spoke with pt. 

## 2012-07-21 ENCOUNTER — Telehealth: Payer: Self-pay | Admitting: Family Medicine

## 2012-07-21 NOTE — Telephone Encounter (Signed)
Pt in [facility] and pharm would like to verify a rx adderall

## 2012-07-21 NOTE — Telephone Encounter (Signed)
I spoke with pharmacy 

## 2012-10-26 ENCOUNTER — Other Ambulatory Visit: Payer: Self-pay | Admitting: Family Medicine

## 2012-10-27 NOTE — Telephone Encounter (Signed)
Refill Xanax #180 with one rf, also Flexeril #270 with one rf

## 2015-06-02 ENCOUNTER — Ambulatory Visit (INDEPENDENT_AMBULATORY_CARE_PROVIDER_SITE_OTHER): Payer: BLUE CROSS/BLUE SHIELD | Admitting: Family Medicine

## 2015-06-02 ENCOUNTER — Ambulatory Visit: Payer: Managed Care, Other (non HMO) | Admitting: Family Medicine

## 2015-06-02 ENCOUNTER — Encounter: Payer: Self-pay | Admitting: Family Medicine

## 2015-06-02 VITALS — BP 124/70 | HR 88 | Temp 97.8°F | Ht 62.0 in | Wt 173.1 lb

## 2015-06-02 DIAGNOSIS — G4700 Insomnia, unspecified: Secondary | ICD-10-CM | POA: Diagnosis not present

## 2015-06-02 DIAGNOSIS — M797 Fibromyalgia: Secondary | ICD-10-CM | POA: Insufficient documentation

## 2015-06-02 DIAGNOSIS — D509 Iron deficiency anemia, unspecified: Secondary | ICD-10-CM

## 2015-06-02 DIAGNOSIS — Z9884 Bariatric surgery status: Secondary | ICD-10-CM

## 2015-06-02 DIAGNOSIS — F902 Attention-deficit hyperactivity disorder, combined type: Secondary | ICD-10-CM

## 2015-06-02 NOTE — Progress Notes (Signed)
Pre visit review using our clinic review tool, if applicable. No additional management support is needed unless otherwise documented below in the visit note. 

## 2015-06-02 NOTE — Progress Notes (Signed)
HPI:  Diane Gibbs is a 50 y.o.female , who is here today to establish care with me.  She is a former pt of Dr Sarajane Jews, moved back to town from [facility] about a months ago, not sure about date, but thinks it was a month ago.   Last PCP , Dr Milford Cage, she does not remember her last physical. Mammogram none in "long time."  She has history of depression, anemia,ADHD, insomnia, and myalgias.  Anemia: Currently he is on B12 1000 mcg monthly. Last lab was 6 months  ago, she does not feel like she needs labs done today. She has followed with hematologists and is on Iron infusions as needed. According to pt, anemia etiology is related to gastro bypass surgery. She reports colonoscopy 3 years ago, negative. Also Hx of B12 deficiency, she is on B12 1000 mcg monthly, which she gives herself. Hx of heavy menses, still having menstrual periods. Reports easy bruising, has been addressed with hematologists. No gum/nose bleed, gross hematuria, or blood in stool.  Has the following chronic problems that require follow up and concerns today:  Insomnia: Years hx. She has been on several treatments, including Ambien, she did not tolerate well. She does not remember other medications she took but the only combination that has helped is Alprazolam 1-2 mg and Lunesta 3 mg. She attributes problem in part to husband's loud snoring.   ADHD: She is not sure about time of Dx but was as an adult.Currently she is on Adderall 20 mg bid. In regard to treatment she tells me that she can not do "the other one" because does not work, she also tried long active Adderall but because s/p bariatric surgery she must have the short acting Adderall 2 times daily. Currently she is not employed.   She has hx of depression, which she states it is not longer active, it was attributed to certain situation. She has not followed with psychiatrists, states "I do not believe in psychiatrists."  Hx of fibromyalgia: Currently she  is on Tramadol 50 mg tid prn, Hydrocodone-Acetaminophen 10-325 mg q 4 hours as needed, and Lyrica 50 mg bid. Today she mentions 2 weeks of right elbow pain, no edema or limitation ROM.No hx of trauma, attributed to repetitive movements while unpacking and cleaning her house.    She tried Cymbalta and Savella in the past and did not work. She does not recall taking Trazodone or Doxepin.    REVIEW OF SYSTEMS:  General: Negative for fever, abnormal weight loss, changes in appetite.+ Fatigue, no more than usual.  Eyes: Negative for conjunctival erythema, visual changes.  ENT: Negative for earache, hearing loss, or ear drainage. Negative nasal congestion, sinus pain, epistaxis. Negative for oral lesions, odynophagia, dysphagia.  Neck: negative for swollen glands or masses.  Cardiac: Negative for chest pain, exertional dyspnea, irregular HR, claudication, cold extremities, or edema.  Respiratory: Negative for cough, dyspnea, wheezing, or hemoptysis.  Abdomen: Negative for abdominal pain, changes in bowel habits, blood in stool or melena, nausea, or vomiting.  GU: Negative for dysuria, urinary frequency or urgency, or gross hematuria. + Heavy menses.  Diane: Denies joint erythema or edema, or limitation of ROM. + Myalgias and arthralgias, stable.   Neurologic: No confusion,  focal weakness, numbness or tingling, frequent/severe headaches, or tremor.  Psychiatric: Denies depression or anxiety. + Sleep disorder.  Skin: Negative for rash, ulcers, or skin color changes.   Past Medical History  Diagnosis Date  . Hx of colonic  polyps   . GERD (gastroesophageal reflux disease)   . Genital warts   . Fibromyalgia   . Depression   . Anemia   . Hx of infectious mononucleosis     Past Surgical History  Procedure Laterality Date  . Sphinceterotomy    . Bilateral breast reduction    . Gastric bypass      Family History  Problem Relation Age of Onset  . Cancer Other     colon  cancer  . Depression Other   . Hypertension Other     Social History   Social History  . Marital Status: Married    Spouse Name: N/A  . Number of Children: N/A  . Years of Education: N/A   Occupational History  . not employed    Social History Main Topics  . Smoking status: Former Smoker    Quit date: 02/08/2005  . Smokeless tobacco: Never Used  . Alcohol Use: No  . Drug Use: No  . Sexual Activity: Not Asked   Other Topics Concern  . None   Social History Narrative    Current Outpatient Prescriptions on File Prior to Visit  Medication Sig Dispense Refill  . ALPRAZolam (XANAX) 1 MG tablet TAKE 2 TABLETS BY MOUTH AT BEDTIME AS NEEDED FOR SLEEP 180 tablet 1  . HYDROcodone-acetaminophen (NORCO) 10-325 MG per tablet Take 1 tablet by mouth every 4 (four) hours as needed for pain. 540 tablet 0   No current facility-administered medications on file prior to visit.     EXAM:  Filed Vitals:   06/02/15 1026  BP: 124/70  Pulse: 88  Temp: 97.8 F (36.6 C)    Body mass index is 31.66 kg/(m^2).   GENERAL: vitals reviewed and listed above. Well developed, appears well hydrated and in no acute distress.  ENT: atraumatic, conjunttiva clear, no obvious abnormalities on inspection of external nose and ears.  NECK: no obvious masses on inspection. No lymphadenopathy appreciated.  LUNGS: clear to auscultation bilaterally, no wheezes, rales or rhonchi, good air movement  CV: Regular rate and rhythm, no murmurs appreciated, no peripheral edema  ABDOMEN: Soft, no tender, no masses or hepatomegaly appreciated.  Diane: moves all extremities without noticeable abnormality. Right medial aspect of elbow mild tenderness, no edema or local erythema, no limitation of ROM.  No trigger points appreciated.  NEURO: Alert and oriented x 3, no focal deficit appreciated, normal gait.  PSYCH: She is cooperative, no obvious depression or anxiety. Well groomed, good eye  contact.   ASSESSMENT AND PLAN:  Discussed the following assessment and plan:  INSOMNIA: Good sleep hygiene. It seems like husband snoring is an aggravating factor, so this needs to be addressed, he may need sleep study.She states that he is not willing to do anything and gets upset when she brings it up. Amitriptyline is a good options and may help with some of ehr other chronic medical problems (fibromyalgia and chronic pain).  Fibromyalgia: Good sleep hygiene. Opioid is not part of recommended treatment. Refused referral to pain management or trying to wean one of her meds at least.  Attention deficit hyperactivity disorder (ADHD), combined type Refused psychiatrist referral. I do not feel comfortable prescribing Adderall when she is also on other controlled medications.  Anemia, iron deficiency Not interested in following on this. According to pt, last labs done about 6 months ago.  S/P bariatric surgery She needs B12,25 OH vit D done;refused.   We reviewed each medication at the time, indications, and other treatment  options for some of her chronic medical conditions. She is not willing to try other options, she is not willing to see psychiatrists or pain management. She is not willing to change pain management,she states that she needs both (Tramadol and Hydrocodone) for different types of pain.  She did not want to have labs done. I explained to Diane Gibbs I do not feel comfortable prescribing all her medications, most controlled, I would like to wean some off and try others but she got upset and left.  She feels like it was a "waste of time" coming here today. . She does not want mammogram ordered.   -We reviewed the PMH, PSH, FH, SH, meds and allergies. -We addressed current concerns per orders and patient instructions. -We have asked for records for pertinent exams, studies, vaccines and notes from previous providers.    -Patient advised to return or notify a  doctor immediately if new concerns arise.      Diane Froemming G. Martinique, MD  [facility] [place] office.

## 2015-09-11 ENCOUNTER — Telehealth: Payer: Self-pay | Admitting: Hematology and Oncology

## 2015-09-11 ENCOUNTER — Encounter: Payer: Self-pay | Admitting: Hematology and Oncology

## 2015-09-11 NOTE — Telephone Encounter (Signed)
Appt scheduled with Alvy Bimler 9/25 at [facility]. Patient wanted the appointment to be scheduled in September. Demographics verified and location given. Letter to the referring and mailed to the patient.

## 2015-09-29 ENCOUNTER — Encounter: Payer: Self-pay | Admitting: Neurology

## 2015-09-29 ENCOUNTER — Ambulatory Visit (INDEPENDENT_AMBULATORY_CARE_PROVIDER_SITE_OTHER): Payer: 59 | Admitting: Neurology

## 2015-09-29 VITALS — BP 120/76 | HR 72 | Resp 16 | Ht 62.0 in | Wt 182.0 lb

## 2015-09-29 DIAGNOSIS — R0683 Snoring: Secondary | ICD-10-CM

## 2015-09-29 DIAGNOSIS — G2581 Restless legs syndrome: Secondary | ICD-10-CM | POA: Diagnosis not present

## 2015-09-29 DIAGNOSIS — G479 Sleep disorder, unspecified: Secondary | ICD-10-CM | POA: Diagnosis not present

## 2015-09-29 DIAGNOSIS — Z9884 Bariatric surgery status: Secondary | ICD-10-CM | POA: Diagnosis not present

## 2015-09-29 NOTE — Patient Instructions (Signed)
Based on your symptoms and your exam I believe you are at risk for obstructive sleep apnea or OSA, and I think we should proceed with a sleep study to determine whether you do or do not have OSA and how severe it is. If you have more than mild OSA, I want you to consider treatment with CPAP. Please remember, the risks and ramifications of moderate to severe obstructive sleep apnea or OSA are: Cardiovascular disease, including congestive heart failure, stroke, difficult to control hypertension, arrhythmias, and even type 2 diabetes has been linked to untreated OSA. Sleep apnea causes disruption of sleep and sleep deprivation in most cases, which, in turn, can cause recurrent headaches, problems with memory, mood, concentration, focus, and vigilance. Most people with untreated sleep apnea report excessive daytime sleepiness, which can affect their ability to drive. Please do not drive if you feel sleepy.   Please bring all your nighttime medications for your sleep study.  I will likely see you back after your sleep study to go over the test results and where to go from there. We will call you after your sleep study to advise about the results (most likely, you will hear from Beverlee Nims, my nurse) and to set up an appointment at the time, as necessary.    Our sleep lab administrative assistant, Arrie Aran will meet with you or call you to schedule your sleep study. If you don't hear back from her by next week please feel free to call her at [phone]. This is her direct line and please leave a message with your phone number to call back if you get the voicemail box. She will call back as soon as possible.

## 2015-09-29 NOTE — Progress Notes (Signed)
Subjective:    Patient ID: Diane Gibbs is a 50 y.o. female.  HPI     Star Age, MD, PhD [facility] [address] [place], [place] [address]  Dear Dr. Tollie Pizza,   I saw your patient, Diane Gibbs, upon your kind request in my neurologic clinic today for initial consultation of her sleep disorder, in particular, concern for underlying obstructive sleep apnea. The patient is unaccompanied today. As you know, Diane Gibbs is a 50 year old right-handed woman with an underlying medical history of ADHD, history of depression, fibromyalgia, chronic insomnia, status post weight loss surgery with gastric bypass in 2009 or so, status post breast reduction surgeries, iron deficiency anemia, reflux disease, vitamin B12 deficiency, and obesity, who reports chronic issues with her sleep. She reports chronic difficulty initiating and maintaining sleep. She snores. She denies frank sleepiness but this tired during the day, fatigue score is 49 out of 63, Epworth sleepiness score is 0 out of 24 today. I reviewed your office note from 09/08/2015 which you kindly included. Blood work from your office from 09/08/2015 was reviewed: TSH was normal, vitamin B12 285, ferritin 4, TIBC 412, iron 32, CMP unremarkable, CBC with differential showed white count of 11.6, neutrophil count 7.5. You made a referral to hematology, she has an appointment pending for September. Current medications include Xanax 1 mg at night, no more Lunesta, Ritalin, Lyrica, tramadol, and vitamin B12.  She reports a bedtime of around 10 PM, takes 1 mg of alprazolam at 9 PM typically. She does not watch TV in bed. She uses a fan, primarily for white noise. Wakeup time is around 6:30. She works as an Lobbyist. She denies morning headaches and has nocturia about once or twice per average night. She has restless leg symptoms but is not sure if she twitches or moves her legs and her  sleep. Her husband does not complain about her sleep, she snores mildly, does not seem to disturb his sleep but his snoring disturbs her sleep she reports. They have 2 dogs, they sleep in the bed with him. She does not smoke and does not drink alcohol, drinks caffeine in the form of iced tea, about 3 glasses per day.  I also reviewed the office note from Dr. Betty Martinique, family medicine, from 06/02/2015, at which time the patient was offered referral to psychiatry for ADHD, and insomnia management, which she declined at the time, she was also offered referral to pain management for her fibromyalgia, which she declined at the time, she was advised to undergo blood work for anemia which she declined at the time.   Her Past Medical History Is Significant For: Past Medical History:  Diagnosis Date  . ADD (attention deficit disorder)   . Anemia   . B12 deficiency   . Depression   . Fibromyalgia   . Genital warts   . GERD (gastroesophageal reflux disease)   . Hx of colonic polyps   . Hx of infectious mononucleosis   . Incontinentia pigmenti   . Insomnia     Her Past Surgical History Is Significant For: Past Surgical History:  Procedure Laterality Date  . bilateral breast reduction    . CESAREAN SECTION    . GASTRIC BYPASS    . sphinceterotomy      Her Family History Is Significant For: Family History  Problem Relation Age of Onset  . Cancer Other     colon cancer  . Depression Other   .  Hypertension Other   . Diabetes Father   . Colon cancer Father   . Hypertension Father     Her Social History Is Significant For: Social History   Social History  . Marital status: Married    Spouse name: N/A  . Number of children: 2  . Years of education: college   Occupational History  . IT sales    Social History Main Topics  . Smoking status: Former Smoker    Quit date: 02/08/2005  . Smokeless tobacco: Never Used  . Alcohol use No  . Drug use: No  . Sexual activity: Not Asked    Other Topics Concern  . None   Social History Narrative   Drinks about 4 cups of tea a day     Her Allergies Are:  Allergies  Allergen Reactions  . Lunesta [Eszopiclone]     Bad taste  :   Her Current Medications Are:  Outpatient Encounter Prescriptions as of 09/29/2015  Medication Sig  . ALPRAZolam (XANAX) 1 MG tablet Take 1 mg by mouth.  Marland Kitchen amphetamine-dextroamphetamine (ADDERALL) 20 MG tablet Take 20 mg by mouth 2 (two) times daily.  . cyanocobalamin (,VITAMIN B-12,) 1000 MCG/ML injection Inject 1,000 mcg into the muscle every 30 (thirty) days.   . pregabalin (LYRICA) 100 MG capsule Take 100 mg by mouth.  . traMADol (ULTRAM) 50 MG tablet 1 po TID prn  . [DISCONTINUED] ALPRAZolam (XANAX) 1 MG tablet TAKE 2 TABLETS BY MOUTH AT BEDTIME AS NEEDED FOR SLEEP  . [DISCONTINUED] Eszopiclone 3 MG TABS Take 3 mg by mouth at bedtime. Take immediately before bedtime  . [DISCONTINUED] HYDROcodone-acetaminophen (NORCO) 10-325 MG per tablet Take 1 tablet by mouth every 4 (four) hours as needed for pain.  . [DISCONTINUED] LYRICA 100 MG capsule   . [DISCONTINUED] methylphenidate (RITALIN) 20 MG tablet   . [DISCONTINUED] Naltrexone-Bupropion HCl ER (CONTRAVE) 8-90 MG TB12 Take 2 tablets by mouth 2 (two) times daily.  . [DISCONTINUED] traMADol (ULTRAM) 50 MG tablet Take 50 mg by mouth every 8 (eight) hours.   No facility-administered encounter medications on file as of 09/29/2015.   :  Review of Systems:  Out of a complete 14 point review of systems, all are reviewed and negative with the exception of these symptoms as listed below:  Review of Systems  HENT:       Reports episodes of nose swelling and blistering, then has trouble getting air through nose.   Neurological:       Patient has trouble falling asleep without medication, has trouble staying asleep, snoring, wakes up feeling tired, daytime tiredness without medication.  Reports that she is in "pain most of the time", has trouble  with memory, bilateral leg pain, and has "trouble sitting still".    Epworth Sleepiness Scale 0= would never doze 1= slight chance of dozing 2= moderate chance of dozing 3= high chance of dozing  Sitting and reading:0 Watching TV:0 Sitting inactive in a public place (ex. Theater or meeting):0 As a passenger in a car for an hour without a break:0 Lying down to rest in the afternoon:0 Sitting and talking to someone:0 Sitting quietly after lunch (no alcohol):0 In a car, while stopped in traffic:0 Total:0   Objective:  Neurologic Exam  Physical Exam Physical Examination:   Vitals:   09/29/15 1318  BP: 120/76  Pulse: 72  Resp: 16    General Examination: The patient is a very pleasant 50 y.o. female in no acute distress. She appears well-developed  and well-nourished and well groomed.   HEENT: Normocephalic, atraumatic, pupils are equal, round and reactive to light and accommodation. Funduscopic exam is normal with sharp disc margins noted. Extraocular tracking is good without limitation to gaze excursion or nystagmus noted. Normal smooth pursuit is noted. Hearing is grossly intact. Face is symmetric with normal facial animation and normal facial sensation. Speech is clear with no dysarthria noted. There is no hypophonia. There is no lip, neck/head, jaw or voice tremor. Neck is supple with full range of passive and active motion. There are no carotid bruits on auscultation. Oropharynx exam reveals: mild mouth dryness, adequate dental hygiene and mild airway crowding, due to smaller airway entry, tonsils of 1+ b/l. Uvula is small. Mallampati is class I. Tongue protrudes centrally and palate elevates symmetrically. Neck size is 13.5 inches. She has a Mild overbite. Nasal inspection reveals no significant nasal mucosal bogginess or redness and no septal deviation.   Chest: Clear to auscultation without wheezing, rhonchi or crackles noted.  Heart: S1+S2+0, regular and normal without  murmurs, rubs or gallops noted.   Abdomen: Soft, non-tender and non-distended with normal bowel sounds appreciated on auscultation.  Extremities: There is no pitting edema in the distal lower extremities bilaterally. Pedal pulses are intact.  Skin: Warm and dry without trophic changes noted. There are no varicose veins.  Musculoskeletal: exam reveals no obvious joint deformities, tenderness or joint swelling or erythema.   Neurologically:  Mental status: The patient is awake, alert and oriented in all 4 spheres. Her immediate and remote memory, attention, language skills and fund of knowledge are appropriate. There is no evidence of aphasia, agnosia, apraxia or anomia. Speech is clear with normal prosody and enunciation. Thought process is linear. Mood is normal and affect is normal.  Cranial nerves II - XII are as described above under HEENT exam. In addition: shoulder shrug is normal with equal shoulder height noted. Motor exam: Normal bulk, strength and tone is noted. There is no drift, tremor or rebound. Romberg is negative. Reflexes are 2+ throughout. Babinski: Toes are flexor bilaterally. Fine motor skills and coordination: intact with normal finger taps, normal hand movements, normal rapid alternating patting, normal foot taps and normal foot agility.  Cerebellar testing: No dysmetria or intention tremor on finger to nose testing. Heel to shin is unremarkable bilaterally. There is no truncal or gait ataxia.  Sensory exam: intact to light touch, pinprick, vibration, temperature sense in the upper and lower extremities.  Gait, station and balance: She stands easily. No veering to one side is noted. No leaning to one side is noted. Posture is age-appropriate and stance is narrow based. Gait shows normal stride length and normal pace. No problems turning are noted. Tandem walk is unremarkable.           Assessment and Plan:  In summary, Diane Gibbs is a very pleasant 50 y.o.-year  old female with an underlying medical history of ADHD, history of depression, fibromyalgia, chronic insomnia, status post weight loss surgery with gastric bypass in 2009 or so, status post breast reduction surgeries, iron deficiency anemia, reflux disease, vitamin B12 deficiency, and obesity, who has chronic difficulty with her sleep, including sleep initiation and maintenance problems and also describes RLS symptoms. I explained to her that RLS is often related to iron deficiency and anemia, and improves with anemia treatment. She has required iron infusions in the past she states. She has an appointment with hematology next month. In addition, she requires B12 injections every month,  she gives these to herself. She does not describe telltale symptoms of sleep apnea, nevertheless, I would suggest proceeding with a sleep study to look for any organic underlying sleep disorder.  I had a long chat with the patient about my findings and the diagnosis of OSA and RLS, the prognosis and treatment options. We talked about medical treatments, surgical interventions and non-pharmacological approaches. I explained in particular the risks and ramifications of untreated moderate to severe OSA, especially with respect to developing cardiovascular disease down the Road, including congestive heart failure, difficult to treat hypertension, cardiac arrhythmias, or stroke. Even type 2 diabetes has, in part, been linked to untreated OSA. Symptoms of untreated OSA include daytime sleepiness, memory problems, mood irritability and mood disorder such as depression and anxiety, lack of energy, as well as recurrent headaches, especially morning headaches. We talked about trying to maintain a healthy lifestyle in general, as well as the importance of weight control. I encouraged the patient to eat healthy, exercise daily and keep well hydrated, to keep a scheduled bedtime and wake time routine, to not skip any meals and eat healthy snacks  in between meals. I advised the patient not to drive when feeling sleepy. I recommended the following at this time: sleep study  with potential positive airway pressure titration. (We will score hypopneas at 4% and split the sleep study into diagnostic and treatment portion, if the estimated. 2 hour AHI is >20/h). We will be on the lookout for PLMs (periodic leg movements of sleep).  I explained the sleep test procedure to the patient and also outlined possible surgical and non-surgical treatment options of OSA, including the use of CPAP and the patient indicated that she would be willing to try CPAP if the need arises. I explained the importance of being compliant with PAP treatment, not only for insurance purposes but primarily to improve Her symptoms, and for the patient's long term health benefit, including to reduce Her cardiovascular risks. I answered all her questions today and the patient was in agreement. I would like to see her back after the sleep study is completed and encouraged her to call with any interim questions, concerns, problems or updates.   Thank you very much for allowing me to participate in the care of this patient. If I can be of any further assistance to you please do not hesitate to call me at [phone].  Sincerely,   Star Age, MD, PhD

## 2015-10-28 ENCOUNTER — Telehealth: Payer: Self-pay | Admitting: Neurology

## 2015-10-28 DIAGNOSIS — G479 Sleep disorder, unspecified: Secondary | ICD-10-CM

## 2015-10-28 DIAGNOSIS — G2581 Restless legs syndrome: Secondary | ICD-10-CM

## 2015-10-28 DIAGNOSIS — R0683 Snoring: Secondary | ICD-10-CM

## 2015-10-28 NOTE — Telephone Encounter (Signed)
[employer] denied SPLIT however HST can be done without authorization.  Can I get an order for HST

## 2015-10-28 NOTE — Telephone Encounter (Signed)
This patient's insurance denied an attended sleep study. I will order home sleep test.  

## 2015-11-03 ENCOUNTER — Encounter: Payer: Self-pay | Admitting: Hematology and Oncology

## 2015-11-03 ENCOUNTER — Ambulatory Visit (HOSPITAL_BASED_OUTPATIENT_CLINIC_OR_DEPARTMENT_OTHER): Payer: 59 | Admitting: Hematology and Oncology

## 2015-11-03 ENCOUNTER — Ambulatory Visit (HOSPITAL_BASED_OUTPATIENT_CLINIC_OR_DEPARTMENT_OTHER): Payer: 59

## 2015-11-03 VITALS — BP 121/77 | HR 64 | Temp 98.1°F | Resp 18 | Ht 62.0 in | Wt 183.0 lb

## 2015-11-03 DIAGNOSIS — E611 Iron deficiency: Secondary | ICD-10-CM

## 2015-11-03 DIAGNOSIS — E559 Vitamin D deficiency, unspecified: Secondary | ICD-10-CM

## 2015-11-03 DIAGNOSIS — Z9884 Bariatric surgery status: Secondary | ICD-10-CM

## 2015-11-03 LAB — CBC & DIFF AND RETIC
BASO%: 0.5 % (ref 0.0–2.0)
Basophils Absolute: 0 10e3/uL (ref 0.0–0.1)
EOS%: 0.8 % (ref 0.0–7.0)
Eosinophils Absolute: 0.1 10e3/uL (ref 0.0–0.5)
HCT: 36.9 % (ref 34.8–46.6)
HGB: 12.1 g/dL (ref 11.6–15.9)
Immature Retic Fract: 3.9 % (ref 1.60–10.00)
LYMPH%: 29.5 % (ref 14.0–49.7)
MCH: 27.5 pg (ref 25.1–34.0)
MCHC: 32.8 g/dL (ref 31.5–36.0)
MCV: 83.9 fL (ref 79.5–101.0)
MONO#: 1 10e3/uL — ABNORMAL HIGH (ref 0.1–0.9)
MONO%: 11.4 % (ref 0.0–14.0)
NEUT#: 5.1 10e3/uL (ref 1.5–6.5)
NEUT%: 57.8 % (ref 38.4–76.8)
Platelets: 292 10e3/uL (ref 145–400)
RBC: 4.4 10e6/uL (ref 3.70–5.45)
RDW: 13 % (ref 11.2–14.5)
Retic %: 1.14 % (ref 0.70–2.10)
Retic Ct Abs: 50.16 10e3/uL (ref 33.70–90.70)
WBC: 8.9 10e3/uL (ref 3.9–10.3)
lymph#: 2.6 10e3/uL (ref 0.9–3.3)

## 2015-11-03 NOTE — Progress Notes (Signed)
[place] NOTE  Patient Care Team: Stephens Shire, MD as PCP - General (Family Medicine)  CHIEF COMPLAINTS/PURPOSE OF CONSULTATION:  Recurrent iron deficiency, status post gastric bypass  HISTORY OF PRESENTING ILLNESS:  Diane Gibbs 50 y.o. female is here because of problem with recurrent iron deficiency. The patient has history of gastric bypass many years ago. 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 I had the opportunity to review her blood count from 2012. Her hemoglobin has been as low as 6.1 to within normal limits at 13.5.  According to the most recent blood work dated 09/08/2015, CBC showed white count of 11.6, hemoglobin 13.5, MCV 85.3 but with evidence of iron deficiency with ferritin as well as for, 8% saturation but normal limits of vitamin B 12 level of 285 The patient had received blood transfusion in 2012. She had received multiple doses of intravenous iron before she relocated to [facility]. Her last endoscopy evaluation was in 2012 with normal EGD and colonoscopy.  She denies recent chest pain on exertion, shortness of breath on minimal exertion, pre-syncopal episodes, or palpitations. She had not noticed any recent bleeding such as epistaxis, hematuria or hematochezia The patient denies over the counter NSAID ingestion. She is not on antiplatelets agents. The patient is still having regular menstruation. She had no prior history or diagnosis of cancer. Her age appropriate screening programs are up-to-date. She complains of pica with excessive chewing of ice. She eats a variety of diet. She never donated blood but she had received blood transfusion in the past The patient was prescribed oral iron supplements in the past and she could not tolerate it because it caused severe constipation and it was was ineffective due to malabsorption. The patient  complained of diffuse musculoskeletal pain and bone pain.  MEDICAL HISTORY:  Past Medical History:  Diagnosis Date  . ADD (attention deficit disorder)   . Anemia   . B12 deficiency   . Depression   . Fibromyalgia   . Genital warts   . GERD (gastroesophageal reflux disease)   . Hx of colonic polyps   . Hx of infectious mononucleosis   . Incontinentia pigmenti   . Insomnia     SURGICAL HISTORY: Past Surgical History:  Procedure Laterality Date  . bilateral breast reduction    . CESAREAN SECTION    . GASTRIC BYPASS    . sphinceterotomy      SOCIAL HISTORY: Social History   Social History  . Marital status: Married    Spouse name: Joe  . Number of children: 2  . Years of education: college   Occupational History  . IT sales    Social History Main Topics  . Smoking status: Former Smoker    Quit date: 02/08/2005  . Smokeless tobacco: Never Used  . Alcohol use No  . Drug use: No  . Sexual activity: Not on file   Other Topics Concern  . Not on file   Social History Narrative   Drinks about 4 cups of tea a day     FAMILY HISTORY: Family History  Problem Relation Age of Onset  . Cancer Other     colon cancer  . Depression Other   . Hypertension Other   . Diabetes Father   . Colon cancer Father   . Hypertension Father     ALLERGIES:  is allergic to lunesta [eszopiclone].  MEDICATIONS:  Current Outpatient Prescriptions  Medication Sig Dispense Refill  . ALPRAZolam (XANAX) 1 MG tablet Take 1 mg by mouth.    Marland Kitchen amphetamine-dextroamphetamine (ADDERALL) 20 MG tablet Take 20 mg by mouth 2 (two) times daily.    . cyanocobalamin (,VITAMIN B-12,) 1000 MCG/ML injection Inject 1,000 mcg into the muscle every 30 (thirty) days.     . pregabalin (LYRICA) 100 MG capsule Take 100 mg by mouth.    . traMADol (ULTRAM) 50 MG tablet 1 po TID prn     No current facility-administered medications for this visit.     REVIEW OF SYSTEMS:   Constitutional: Denies fevers, chills  or abnormal night sweats Eyes: Denies blurriness of vision, double vision or watery eyes Ears, nose, mouth, throat, and face: Denies mucositis or sore throat Respiratory: Denies cough, dyspnea or wheezes Cardiovascular: Denies palpitation, chest discomfort or lower extremity swelling Gastrointestinal:  Denies nausea, heartburn or change in bowel habits Skin: Denies abnormal skin rashes Lymphatics: Denies new lymphadenopathy or easy bruising Neurological:Denies numbness, tingling or new weaknesses Behavioral/Psych: Mood is stable, no new changes  All other systems were reviewed with the patient and are negative.  PHYSICAL EXAMINATION: ECOG PERFORMANCE STATUS: 1 - Symptomatic but completely ambulatory  Vitals:   11/03/15 1354  BP: 121/77  Pulse: 64  Resp: 18  Temp: 98.1 F (36.7 C)   Filed Weights   11/03/15 1354  Weight: 183 lb (83 kg)    GENERAL:alert, no distress and comfortable. She is mildly obese SKIN: skin color, texture, turgor are normal, no rashes or significant lesions EYES: normal, conjunctiva are pink and non-injected, sclera clear OROPHARYNX:no exudate, no erythema and lips, buccal mucosa, and tongue normal  NECK: supple, thyroid normal size, non-tender, without nodularity LYMPH:  no palpable lymphadenopathy in the cervical, axillary or inguinal LUNGS: clear to auscultation and percussion with normal breathing effort HEART: regular rate & rhythm and no murmurs and no lower extremity edema ABDOMEN:abdomen soft, non-tender and normal bowel sounds Musculoskeletal:no cyanosis of digits and no clubbing  PSYCH: alert & oriented x 3 with fluent speech NEURO: no focal motor/sensory deficits  ASSESSMENT & PLAN:  Iron deficiency The most likely cause of her anemia is due to chronic blood loss/malabsorption syndrome. We discussed some of the risks, benefits, and alternatives of intravenous iron infusions. The patient is symptomatic from anemia and the iron level is  critically low. She tolerated oral iron supplement poorly and desires to achieved higher levels of iron faster for adequate hematopoesis. Some of the side-effects to be expected including risks of infusion reactions, phlebitis, headaches, nausea and fatigue.  The patient is willing to proceed. Patient education material was dispensed.  Goal is to keep ferritin level greater than 50 After IV iron infusion, I plan to recheck her blood work again in 6 months. With her history of gastric bypass, it is likely the patient will be dependent on intravenous iron long-term.   S/P gastric bypass The patient is at risk for multiple mineral deficiencies. She is giving herself vitamin B 12 injections. I recommend checking vitamin D level  Vitamin D deficiency She has signs and symptoms of vitamin D deficiency with diffuse myalgias and arthralgias. The patient is at risk for vitamin D deficiency in view of possible malabsorption from history of gastric bypass. I recommend checking vitamin D level now and will call her with test results and treat it if her level comes back low  Orders Placed This Encounter  Procedures  . CBC & Diff and  Retic    Standing Status:   Future    Number of Occurrences:   1    Standing Expiration Date:   12/07/2016  . Ferritin    Standing Status:   Future    Number of Occurrences:   1    Standing Expiration Date:   12/07/2016  . Vitamin D 25 hydroxy    Standing Status:   Future    Number of Occurrences:   1    Standing Expiration Date:   12/07/2016  . Ferritin    Standing Status:   Future    Standing Expiration Date:   12/07/2016  . CBC & Diff and Retic    Standing Status:   Future    Standing Expiration Date:   12/07/2016  . Vitamin D 25 hydroxy    Standing Status:   Future    Standing Expiration Date:   12/07/2016     All questions were answered. The patient knows to call the clinic with any problems, questions or concerns. I spent 40 minutes counseling the  patient face to face. The total time spent in the appointment was 55 minutes and more than 50% was on counseling.     Heath Lark, MD 11/03/15 2:46 PM

## 2015-11-03 NOTE — Assessment & Plan Note (Signed)
She has signs and symptoms of vitamin D deficiency with diffuse myalgias and arthralgias. The patient is at risk for vitamin D deficiency in view of possible malabsorption from history of gastric bypass. I recommend checking vitamin D level now and will call her with test results and treat it if her level comes back low

## 2015-11-03 NOTE — Assessment & Plan Note (Signed)
The most likely cause of her anemia is due to chronic blood loss/malabsorption syndrome. We discussed some of the risks, benefits, and alternatives of intravenous iron infusions. The patient is symptomatic from anemia and the iron level is critically low. She tolerated oral iron supplement poorly and desires to achieved higher levels of iron faster for adequate hematopoesis. Some of the side-effects to be expected including risks of infusion reactions, phlebitis, headaches, nausea and fatigue.  The patient is willing to proceed. Patient education material was dispensed.  Goal is to keep ferritin level greater than 50 After IV iron infusion, I plan to recheck her blood work again in 6 months. With her history of gastric bypass, it is likely the patient will be dependent on intravenous iron long-term.

## 2015-11-03 NOTE — Assessment & Plan Note (Signed)
The patient is at risk for multiple mineral deficiencies. She is giving herself vitamin B 12 injections. I recommend checking vitamin D level

## 2015-11-04 LAB — FERRITIN: FERRITIN: 5 ng/mL — AB (ref 9–269)

## 2015-11-05 LAB — VITAMIN D 25 HYDROXY (VIT D DEFICIENCY, FRACTURES): VIT D 25 HYDROXY: 23.6 ng/mL — AB (ref 30.0–100.0)

## 2015-11-06 ENCOUNTER — Other Ambulatory Visit: Payer: Self-pay | Admitting: Hematology and Oncology

## 2015-11-06 ENCOUNTER — Telehealth: Payer: Self-pay | Admitting: *Deleted

## 2015-11-06 MED ORDER — ERGOCALCIFEROL 1.25 MG (50000 UT) PO CAPS: 50000.0000 [IU] | ORAL_CAPSULE | ORAL | 0 refills | Status: DC

## 2015-11-06 NOTE — Telephone Encounter (Signed)
-----   Message from Heath Lark, MD sent at 11/06/2015  9:27 AM EDT ----- Regarding: low vitamin D Vitamin D is low She needs high dose vitamin D 50,000 units weekly PO x 12 weeks. You may call in prescription She has a scheduling mistake. I cancelled her labs in October and request labs to be drawn in April. I have placed new scheduling msg and plan to recheck her labs then ----- Message ----- From: Interface, Lab In Three Zero One Sent: 11/03/2015   2:53 PM To: Heath Lark, MD

## 2015-11-06 NOTE — Telephone Encounter (Signed)
Informed pt of Dr. Calton Dach message and new Rx for weekly Vitamin D sent to her [facility] on [address].  Informed her lab in Oct canceled and next lab in April.  She verbalized understanding and confirmed appts for Iron infusion next week and the following week.

## 2015-11-12 ENCOUNTER — Ambulatory Visit (HOSPITAL_BASED_OUTPATIENT_CLINIC_OR_DEPARTMENT_OTHER): Payer: 59

## 2015-11-12 VITALS — BP 125/67 | HR 67 | Temp 98.2°F | Resp 17

## 2015-11-12 DIAGNOSIS — E611 Iron deficiency: Secondary | ICD-10-CM

## 2015-11-12 DIAGNOSIS — Z9884 Bariatric surgery status: Secondary | ICD-10-CM | POA: Diagnosis not present

## 2015-11-12 MED ORDER — SODIUM CHLORIDE 0.9 % IV SOLN
510.0000 mg | Freq: Once | INTRAVENOUS | Status: AC
  Administered 2015-11-12: 510 mg via INTRAVENOUS
  Filled 2015-11-12: qty 17

## 2015-11-12 MED ORDER — SODIUM CHLORIDE 0.9 % IV SOLN
Freq: Once | INTRAVENOUS | Status: AC
  Administered 2015-11-12: 15:00:00 via INTRAVENOUS

## 2015-11-12 NOTE — Patient Instructions (Signed)

## 2015-11-17 ENCOUNTER — Other Ambulatory Visit: Payer: 59

## 2015-11-19 ENCOUNTER — Ambulatory Visit (HOSPITAL_BASED_OUTPATIENT_CLINIC_OR_DEPARTMENT_OTHER): Payer: 59

## 2015-11-19 VITALS — BP 117/64 | HR 67 | Temp 98.0°F | Resp 18

## 2015-11-19 DIAGNOSIS — E611 Iron deficiency: Secondary | ICD-10-CM | POA: Diagnosis not present

## 2015-11-19 DIAGNOSIS — Z9884 Bariatric surgery status: Secondary | ICD-10-CM

## 2015-11-19 MED ORDER — SODIUM CHLORIDE 0.9 % IV SOLN
Freq: Once | INTRAVENOUS | Status: AC
  Administered 2015-11-19: 14:00:00 via INTRAVENOUS

## 2015-11-19 MED ORDER — SODIUM CHLORIDE 0.9 % IV SOLN
510.0000 mg | Freq: Once | INTRAVENOUS | Status: AC
  Administered 2015-11-19: 510 mg via INTRAVENOUS
  Filled 2015-11-19: qty 17

## 2015-11-19 NOTE — Patient Instructions (Signed)

## 2015-11-28 ENCOUNTER — Telehealth: Payer: Self-pay | Admitting: *Deleted

## 2015-11-28 NOTE — Telephone Encounter (Signed)
Pt left VM states she has had her period for "2 weeks and it has not stopped."   Says she has had 2 iron infusions and does not feel any better, in fact, she feels worse. She is concerned about her bleeding and says it started after she got the iron.

## 2015-11-28 NOTE — Telephone Encounter (Signed)
She needs to contact PCP/GYN to manage menorrhagia IV iron does not cause bleeding Avoid NSAID/Aspirin containing products Some patients feel "bad" because of fatigue from menorrhagia. I can check her blood count today but will not be able to address her mnorrhagia

## 2015-11-28 NOTE — Telephone Encounter (Signed)
Informed pt of Dr. Calton Dach note.  Pt says she will call PCP today and see if someone can see her and do labs there.  She says she does not have GYN since she "gave birth" many years ago.   Informed pt it is very important to have routine PAP smears and GYN exam.   This amt of bleeding is not normal.  She needs to be evaluated by PCP if she does not have GYN.  She verbalized understanding.

## 2016-01-06 ENCOUNTER — Telehealth: Payer: Self-pay | Admitting: Neurology

## 2016-01-06 NOTE — Telephone Encounter (Signed)
I called patient. She was initially referred to the practice for sleep apnea evaluation, had in lab sleep test ordered (then denied by insurance) and then had HST ordered, but she did not hear about setting it up. She went to her PCP recently for memory loss evaluation, and then was referred back to [place] for memory loss. Apparently she misunderstood and thought that Dr. Rexene Alberts only specializes in sleep disorders and does not evaluate memory loss patients. I clarified the situation and explained that all the [place] neurologists see memory loss evaluations.   Patient appreciated the information and agrees to see Dr. Rexene Alberts for consultation of memory loss.   Penni Bombard, MD [id number], [id number] PM Certified in Neurology, Neurophysiology and Neuroimaging  [facility] [address] [place], [place] [address] [phone]

## 2016-01-06 NOTE — Telephone Encounter (Signed)
Please schedule patient as per her request. I saw her for sleep d/o, but sleep test was not done for some reason, I will try to find out from [facility].

## 2016-01-29 ENCOUNTER — Encounter: Payer: Self-pay | Admitting: Neurology

## 2016-01-29 ENCOUNTER — Ambulatory Visit (INDEPENDENT_AMBULATORY_CARE_PROVIDER_SITE_OTHER): Payer: 59 | Admitting: Neurology

## 2016-01-29 VITALS — BP 124/71 | HR 74 | Resp 20 | Ht 62.0 in | Wt 192.0 lb

## 2016-01-29 DIAGNOSIS — R419 Unspecified symptoms and signs involving cognitive functions and awareness: Secondary | ICD-10-CM

## 2016-01-29 NOTE — Progress Notes (Signed)
Subjective:    Patient ID: Diane Gibbs is a 50 y.o. female.  HPI     Interim history:   Dear Donnamarie Rossetti,     I saw your patient, Diane Gibbs, upon your kind request in my neurologic clinic today for initial consultation of her memory loss. The patient is unaccompanied today. I've seen her once before on 09/29/2015 for her sleep difficulties and I ordered a sleep study which we had to switch to a home sleep test. She did not have this done.  As you know, Diane Gibbs is a 50 year old right-handed woman with an underlying medical history of depression, fibromyalgia, insomnia, weight loss surgery in 2009, ADHD, iron deficiency anemia, reflux disease, vitamin B12 deficiency and obesity, who reports memory loss for the past year. She reports difficulty with word finding and feeling of being in a daze, confusion. I reviewed your office note from 12/30/2015, which you kindly included. Sleep disorder, in particular, concern for underlying obstructive sleep apnea. The patient is unaccompanied today.  She moved to [facility] in June, was Dx with ADHD about a year ago, when still in [facility].  Has anemia and started having heavy periods for the past year, recently saw Gyn and was started on medroxyprogesterone.  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  Does not wish to pursue a sleep study.  Has gained about 10 lb in 2 months. No FHx of AD. May be a remote cousin of her mother's had Alzheimer's disease. Patient reports that she has trouble maintaining a job. She has difficulty performing her job and has in particular difficulty processing and focusing. Adderall has been helpful she feels. She takes it twice daily but after about 3 PM she feels that it has worn off. She has not seen a psychologist or psychiatrist for management of her ADHD. She is not sure if she drinks enough water. She is not able to quantify how much fluid she takes in. She denies  morning headaches. She has nocturia once per average night. She recently got new eyeglasses.  Previously:   09/29/2015: Diane Gibbs is a 50 year old right-handed woman with an underlying medical history of ADHD, history of depression, fibromyalgia, chronic insomnia, status post weight loss surgery with gastric bypass in 2009 or so, status post breast reduction surgeries, iron deficiency anemia, reflux disease, vitamin B12 deficiency, and obesity, who reports chronic issues with her sleep. She reports chronic difficulty initiating and maintaining sleep. She snores. She denies frank sleepiness but this tired during the day, fatigue score is 49 out of 63, Epworth sleepiness score is 0 out of 24 today. I reviewed your office note from 09/08/2015 which you kindly included. Blood work from your office from 09/08/2015 was reviewed: TSH was normal, vitamin B12 285, ferritin 4, TIBC 412, iron 32, CMP unremarkable, CBC with differential showed white count of 11.6, neutrophil count 7.5. You made a referral to hematology, she has an appointment pending for September. Current medications include Xanax 1 mg at night, no more Lunesta, Ritalin, Lyrica, tramadol, and vitamin B12.   She reports a bedtime of around 10 PM, takes 1 mg of alprazolam at 9 PM typically. She does not watch TV in bed. She uses a fan, primarily for white noise. Wakeup time is around 6:30. She works as an Lobbyist. She denies morning headaches and has nocturia about once or twice per average night. She has restless leg symptoms but  is not sure if she twitches or moves her legs and her sleep. Her husband does not complain about her sleep, she snores mildly, does not seem to disturb his sleep but his snoring disturbs her sleep she reports. They have 2 dogs, they sleep in the bed with him. She does not smoke and does not drink alcohol, drinks caffeine in the form of iced tea, about 3 glasses per day.   I also reviewed the office note from Dr.  Betty Martinique, family medicine, from 06/02/2015, at which time the patient was offered referral to psychiatry for ADHD, and insomnia management, which she declined at the time, she was also offered referral to pain management for her fibromyalgia, which she declined at the time, she was advised to undergo blood work for anemia which she declined at the time.   Her Past Medical History Is Significant For: Past Medical History:  Diagnosis Date  . ADD (attention deficit disorder)   . Anemia   . B12 deficiency   . Depression   . Fibromyalgia   . Genital warts   . GERD (gastroesophageal reflux disease)   . Hx of colonic polyps   . Hx of infectious mononucleosis   . Incontinentia pigmenti   . Insomnia     Her Past Surgical History Is Significant For: Past Surgical History:  Procedure Laterality Date  . bilateral breast reduction    . CESAREAN SECTION    . GASTRIC BYPASS    . sphinceterotomy      Her Family History Is Significant For: Family History  Problem Relation Age of Onset  . Cancer Other     colon cancer  . Depression Other   . Hypertension Other   . Diabetes Father   . Colon cancer Father   . Hypertension Father     Her Social History Is Significant For: Social History   Social History  . Marital status: Married    Spouse name: Joe  . Number of children: 2  . Years of education: college   Occupational History  . IT sales    Social History Main Topics  . Smoking status: Former Smoker    Quit date: 02/08/2005  . Smokeless tobacco: Never Used  . Alcohol use No  . Drug use: No  . Sexual activity: Not Asked   Other Topics Concern  . None   Social History Narrative   Drinks about 4 cups of tea a day     Her Allergies Are:  Allergies  Allergen Reactions  . Lunesta [Eszopiclone]     Bad taste  :   Her Current Medications Are:  Outpatient Encounter Prescriptions as of 01/29/2016  Medication Sig  . ALPRAZolam (XANAX) 1 MG tablet Take 1 mg by mouth.  Marland Kitchen  amphetamine-dextroamphetamine (ADDERALL) 20 MG tablet Take 20 mg by mouth 2 (two) times daily.  . cyanocobalamin (,VITAMIN B-12,) 1000 MCG/ML injection Inject 1,000 mcg into the muscle every 30 (thirty) days.   . ergocalciferol (VITAMIN D2) 50000 units capsule Take 1 capsule (50,000 Units total) by mouth once a week.  . medroxyPROGESTERone (PROVERA) 10 MG tablet Take 10 mg by mouth 2 (two) times daily.  . pregabalin (LYRICA) 100 MG capsule Take 100 mg by mouth.  . traMADol (ULTRAM) 50 MG tablet 1 po TID prn   No facility-administered encounter medications on file as of 01/29/2016.   : Review of Systems:  Out of a complete 14 point review of systems, all are reviewed and negative with the  exception of these symptoms as listed below:  Review of Systems  Constitutional: Positive for fatigue.  Genitourinary: Positive for frequency.  Musculoskeletal: Positive for myalgias.  Neurological: Positive for dizziness, speech difficulty and weakness.       Pt presents today to discuss her memory. Pt says that her memory has gotten worse over the past year. Pt says that she does not need a sleep study.    Objective:  Neurologic Exam  Physical Exam Physical Examination:   Vitals:   01/29/16 1458  BP: 124/71  Pulse: 74  Resp: 20    General Examination: The patient is a very pleasant 50 y.o. female in no acute distress. She appears well-developed and well-nourished and well groomed.   HEENT: Normocephalic, atraumatic, pupils are equal, round and reactive to light and accommodation. Extraocular tracking is good without limitation to gaze excursion or nystagmus noted. Normal smooth pursuit is noted. Hearing is grossly intact. Face is symmetric with normal facial animation and normal facial sensation. Speech is clear with no dysarthria noted. There is no hypophonia. There is no lip, neck/head, jaw or voice tremor. Neck is supple with full range of passive and active motion. There are no carotid bruits  on auscultation. Oropharynx exam reveals: mild mouth dryness, adequate dental hygiene and mild airway crowding, due to smaller airway entry, tonsils of 1+ b/l. Uvula is small. Mallampati is class I. Tongue protrudes centrally and palate elevates symmetrically.    Chest: Clear to auscultation without wheezing, rhonchi or crackles noted.  Heart: S1+S2+0, regular and normal without murmurs, rubs or gallops noted.   Abdomen: Soft, non-tender and non-distended with normal bowel sounds appreciated on auscultation.  Extremities: There is trace puffiness is noted in the distal lower extremities bilaterally.   Skin: Warm and dry without trophic changes noted. There are no varicose veins.  Musculoskeletal: exam reveals no obvious joint deformities, tenderness or joint swelling or erythema.   Neurologically:  Mental status: The patient is awake, alert and oriented in all 4 spheres. Her immediate and remote memory, attention, language skills and fund of knowledge are appropriate. There is no evidence of aphasia, agnosia, apraxia or anomia. Speech is clear with normal prosody and enunciation. Thought process is linear. Mood is normal and affect is mildly blunted.  On 01/29/2016: MMSE: 23/30 (), CDT: 4/4, AFT: 13/mishe lost one point on exact date, one point on clinic and doctor's name, 4 points on the serial sevens.   Cranial nerves II - XII are as described above under HEENT exam. In addition: shoulder shrug is normal with equal shoulder height noted. Motor exam: Normal bulk, strength and tone is noted. There is no drift, tremor or rebound. Romberg is negative. Reflexes are 2+ throughout. Fine motor skills and coordination: intact with normal finger taps, normal hand movements, normal rapid alternating patting, normal foot taps and normal foot agility.  Cerebellar testing: No dysmetria or intention tremor on finger to nose testing. Heel to shin is unremarkable bilaterally. There is no truncal or gait ataxia.   Sensory exam: intact to light touch in the upper and lower extremities.  Gait, station and balance: She stands easily. No veering to one side is noted. No leaning to one side is noted. Posture is age-appropriate and stance is narrow based. Gait shows normal stride length and normal pace. No problems turning are noted. Tandem walk is unremarkable.           Assessment and Plan:  In summary, Diane Gibbs is a 50 year old  female with an underlying medical history of ADHD, history of depression, fibromyalgia, chronic insomnia, status post weight loss surgery with gastric bypass in 2009, status post breast reduction surgeries, iron deficiency anemia, reflux disease, vitamin B12 deficiency, and obesity, who  presents for initial consultation of her memory loss of about one years duration. She feels that her memory loss has become worse. She has difficulty holding a job. She does not have significant vascular risk factors or a family history of Alzheimer's disease. She is advised that memory loss can have many reasons. We will proceed with a brain MRI and referral to neuropsychology for cognitive testing. She may benefit from seeing a psychiatrist for optimization of ADHD medication. She is furthermore advised that if she has underlying obstructive sleep apnea she may benefit from treatment. She does not wish to proceed with a sleep study. I had ordered a lab attended sleep study in August 2017 which was denied by her insurance but she was approved for a home sleep test which she did not wish to pursue. She is advised to maintain good hydration with water. We will call her with her test results. I will see her back routinely in a few months, sooner as needed.  We talked about trying to maintain a healthy lifestyle in general, as well as the importance of weight control.  I answered all her questions today and she was in agreement. Thank you very much for allowing me to participate in the care of this  patient. If I can be of any further assistance to you please do not hesitate to call me at [phone].  Sincerely,   Star Age, MD, PhD

## 2016-01-29 NOTE — Patient Instructions (Signed)
You have complaints of memory loss: memory loss or changes in cognitive function can have many reasons and does not always mean you have dementia. Conditions that can contribute to subjective or objective memory loss include: depression, stress, poor sleep from insomnia or sleep apnea, dehydration, fluctuation in blood sugar values, thyroid or electrolyte dysfunction and certain vitamin deficiencies. Dementia can be caused by stroke, brain atherosclerosis or brain vascular disease due to vascular risk factors (smoking, high blood pressure, high cholesterol, obesity and uncontrolled diabetes), certain degenerative brain disorders (including Parkinson's disease and Multiple sclerosis) and by Alzheimer's disease or other, more rare and sometimes hereditary causes. We will do some additional testing: we will do a brain scan. We will also request a formal cognitive test called neuropsychological evaluation which is done by a licensed neuropsychologist. We will make a referral in that regard. We will call you with brain scan test results and monitor your symptoms. Your memory loss is rather mild at this point, which, of course is reassuring.

## 2016-02-23 ENCOUNTER — Other Ambulatory Visit (HOSPITAL_COMMUNITY): Payer: Self-pay | Admitting: Obstetrics and Gynecology

## 2016-02-23 DIAGNOSIS — R609 Edema, unspecified: Secondary | ICD-10-CM

## 2016-02-24 ENCOUNTER — Ambulatory Visit (HOSPITAL_COMMUNITY)
Admission: RE | Admit: 2016-02-24 | Discharge: 2016-02-24 | Disposition: A | Discharge status: Home / Self Care | Payer: 59 | Source: Ambulatory Visit | Attending: Family Medicine | Admitting: Family Medicine

## 2016-02-24 DIAGNOSIS — R609 Edema, unspecified: Secondary | ICD-10-CM

## 2016-02-24 DIAGNOSIS — M79669 Pain in unspecified lower leg: Secondary | ICD-10-CM | POA: Insufficient documentation

## 2016-02-24 NOTE — Progress Notes (Signed)
**  Preliminary report by tech**  Left lower extremity venous duplex complete. There is no evidence of deep or superficial vein thrombosis involving the left lower extremity. All visualized vessels appear patent and compressible. There is no evidence of a Baker's cyst on the left.  Results were given to Latoya at [phone].  02/24/16 1:36 PM Diane Gibbs RVT

## 2016-03-10 ENCOUNTER — Other Ambulatory Visit: Payer: Self-pay | Admitting: Obstetrics and Gynecology

## 2016-03-10 ENCOUNTER — Other Ambulatory Visit (HOSPITAL_COMMUNITY): Payer: Self-pay | Admitting: Obstetrics and Gynecology

## 2016-03-10 DIAGNOSIS — R52 Pain, unspecified: Secondary | ICD-10-CM

## 2016-03-11 ENCOUNTER — Encounter (HOSPITAL_COMMUNITY): Payer: 59

## 2016-03-11 NOTE — H&P (Signed)
Diane Gibbs is a 51 y.o.    female,  P: 2-0-0-2 who presents for hysterectomy because of menorrhagia. Over the past 6 years the patient has had very heavy periods that for the past year have been debilitating.  Her flow will last for 7 days to 3 weeks with the change of an Ultra Tampon hourly.  Her cramping is rated 5/10 on a 10 point pain scale and is relieved with OTC analgesia.  Most recently the patient was given Provera 20 mg to control her bleeding but it had no effect.  An endometrial biopsy in January 2018 was benign and a sono-hysterogram at the same time showed: anteverted uterus: 8.50 x 6.35 x 5.30 cm, endometrium: 5.27 mm with no obvious abnormalities or masses; #2 fibroids: fundal sub-serosal-1.72 cm and left mid-intramural-1.32 cm; left ovary: 2.05 cm and right ovary: 2.14 cm.  The patient denies any changes in bowel or bladder function. lower back pain or dyspareunia. A review of both medical and surgical management options were given to the patient however,  she has decided to proceed with definitive therapy in the form of hysterectomy.   Past Medical History  OB History: G:2;  P: 2-0-0-2;  C-section 1991 and 1995  GYN History: menarche: 51 YO    LMP: menopausal    Contracepton vasectomy  The patient denies history of sexually transmitted disease.  Denies history of abnormal PAP smear.   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  ROS: Admits to glasses, bilateral lower extremity edema (intermittent) but  denies headache, vision changes, nasal congestion, dysphagia, tinnitus, dizziness, hoarseness, cough,  chest pain, shortness of breath, nausea, vomiting, diarrhea,constipation,  urinary frequency, urgency  dysuria, hematuria, vaginitis symptoms, pelvic pain, swelling of joints,easy bruising,  arthralgias, skin rashes, unexplained weight loss and except as is mentioned in the history of present illness, patient's review of systems is otherwise negative.    Physical Exam Bp: 114/62   P: 88  R: 16    Temperature: 98.9 degrees F orally   Weight:  202 lbs.  Height: 5\' 2"   BMI: 36.9  (from 02/23/2016) Neck: supple without masses or thyromegaly Lungs: clear to auscultation Heart: regular rate and rhythm Abdomen: soft, non-tender and no organomegaly Pelvic:EGBUS- wnl; vagina-normal rugae; uterus-normal size, cervix without lesions or motion tenderness; adnexae-no tenderness or masses Extremities:  no clubbing or  cyanosis but bilateral lower extremity edema; negative Homan's, no pitting, palpable cords and  pedal pulses present. (Venous Doppler Studies 02/24/16 negative for DVT and Baker's Cyst)   Assesment:  Menorrhagia  Uterine Fibroids  Disposition:  A discussion was held with patient regarding the indication for her procedure(s) along with the risks, which include but are not limited to: reaction to anesthesia, damage to adjacent organs, infection and excessive bleeding. The patient verbalized understanding of these risks and has consented to proceed with a Laparoscopically Assisted Vaginal Hysterectomy,  Bilateral Salpingectomy with Possible Abdominal Hysterectomy at [place] on March 22, 2016.    CSN# [id number]   Genevieve Arbaugh J. Florene Glen, PA-C  for Dr. Franklyn Lor. Dillard

## 2016-03-12 NOTE — Patient Instructions (Addendum)
Your procedure is scheduled on:  Monday, Mar 22, 2016  Enter through the [employer] [facility] at:  7:00 AM  Pick up the phone at the desk and dial [phone].  Call this number if you have problems the morning of surgery: [phone].  Remember: Do NOT eat food or drink after:  Midnight Sunday, Feb. 11, 2018  Take these medicines the morning of surgery with a SIP OF WATER:  Lyrica  Stop ALL herbal medications at this time  Do NOT smoke the day of surgery.  Do NOT wear jewelry (body piercing), metal hair clips/bobby pins, make-up, or nail polish. Do NOT wear lotions, powders, or perfumes.  You may wear deodorant. Do NOT shave for 48 hours prior to surgery. Do NOT bring valuables to the hospital. Contacts, dentures, or bridgework may not be worn into surgery.  Leave suitcase in car.  After surgery it may be brought to your room.  For patients admitted to the hospital, checkout time is 11:00 AM the day of discharge.   Bring a copy of your healthcare power of attorney and living will documents.  **Effective Friday, Jan. 12, 2018, [place] will implement no hospital visitations from children age 75 and younger due to a steady increase in flu activity in our community and hospitals. **

## 2016-03-15 ENCOUNTER — Encounter (HOSPITAL_COMMUNITY): Payer: Self-pay

## 2016-03-15 ENCOUNTER — Encounter (HOSPITAL_COMMUNITY)
Admission: RE | Admit: 2016-03-15 | Discharge: 2016-03-15 | Disposition: A | Discharge status: Home / Self Care | Payer: Managed Care, Other (non HMO) | Source: Ambulatory Visit | Attending: Obstetrics and Gynecology | Admitting: Obstetrics and Gynecology

## 2016-03-15 DIAGNOSIS — N921 Excessive and frequent menstruation with irregular cycle: Secondary | ICD-10-CM | POA: Insufficient documentation

## 2016-03-15 HISTORY — DX: Pain in right leg: M79.605

## 2016-03-15 HISTORY — DX: Vitamin D deficiency, unspecified: E55.9

## 2016-03-15 HISTORY — DX: Pain in right leg: M79.604

## 2016-03-15 LAB — CBC
HCT: 38.1 % (ref 36.0–46.0)
HEMOGLOBIN: 13.2 g/dL (ref 12.0–15.0)
MCH: 30.8 pg (ref 26.0–34.0)
MCHC: 34.6 g/dL (ref 30.0–36.0)
MCV: 89 fL (ref 78.0–100.0)
Platelets: 296 10*3/uL (ref 150–400)
RBC: 4.28 MIL/uL (ref 3.87–5.11)
RDW: 12.8 % (ref 11.5–15.5)
WBC: 10.3 10*3/uL (ref 4.0–10.5)

## 2016-03-22 ENCOUNTER — Encounter (HOSPITAL_COMMUNITY): Payer: Self-pay

## 2016-03-22 ENCOUNTER — Ambulatory Visit (HOSPITAL_COMMUNITY): Payer: 59 | Admitting: Anesthesiology

## 2016-03-22 ENCOUNTER — Observation Stay (HOSPITAL_COMMUNITY)
Admission: RE | Admit: 2016-03-22 | Discharge: 2016-03-23 | Disposition: A | Discharge status: Home / Self Care | Payer: 59 | Source: Ambulatory Visit | Attending: Obstetrics and Gynecology | Admitting: Obstetrics and Gynecology

## 2016-03-22 ENCOUNTER — Encounter (HOSPITAL_COMMUNITY)
Admission: RE | Disposition: A | Discharge status: Home / Self Care | Payer: Self-pay | Source: Ambulatory Visit | Attending: Obstetrics and Gynecology

## 2016-03-22 DIAGNOSIS — Z9884 Bariatric surgery status: Secondary | ICD-10-CM | POA: Diagnosis not present

## 2016-03-22 DIAGNOSIS — D649 Anemia, unspecified: Secondary | ICD-10-CM | POA: Diagnosis not present

## 2016-03-22 DIAGNOSIS — E538 Deficiency of other specified B group vitamins: Secondary | ICD-10-CM | POA: Insufficient documentation

## 2016-03-22 DIAGNOSIS — E559 Vitamin D deficiency, unspecified: Secondary | ICD-10-CM | POA: Diagnosis not present

## 2016-03-22 DIAGNOSIS — N920 Excessive and frequent menstruation with regular cycle: Secondary | ICD-10-CM | POA: Diagnosis present

## 2016-03-22 DIAGNOSIS — Z87891 Personal history of nicotine dependence: Secondary | ICD-10-CM | POA: Insufficient documentation

## 2016-03-22 DIAGNOSIS — G4700 Insomnia, unspecified: Secondary | ICD-10-CM | POA: Insufficient documentation

## 2016-03-22 DIAGNOSIS — D259 Leiomyoma of uterus, unspecified: Secondary | ICD-10-CM | POA: Diagnosis not present

## 2016-03-22 DIAGNOSIS — M797 Fibromyalgia: Secondary | ICD-10-CM | POA: Diagnosis not present

## 2016-03-22 DIAGNOSIS — Z79899 Other long term (current) drug therapy: Secondary | ICD-10-CM | POA: Insufficient documentation

## 2016-03-22 DIAGNOSIS — F909 Attention-deficit hyperactivity disorder, unspecified type: Secondary | ICD-10-CM | POA: Diagnosis not present

## 2016-03-22 DIAGNOSIS — N83291 Other ovarian cyst, right side: Secondary | ICD-10-CM | POA: Insufficient documentation

## 2016-03-22 DIAGNOSIS — N838 Other noninflammatory disorders of ovary, fallopian tube and broad ligament: Secondary | ICD-10-CM | POA: Insufficient documentation

## 2016-03-22 HISTORY — PX: LAPAROSCOPIC VAGINAL HYSTERECTOMY WITH SALPINGECTOMY: SHX6680

## 2016-03-22 HISTORY — PX: CYSTOSCOPY: SHX5120

## 2016-03-22 SURGERY — HYSTERECTOMY, VAGINAL, LAPAROSCOPY-ASSISTED, WITH SALPINGECTOMY
Anesthesia: General | Site: Bladder

## 2016-03-22 MED ORDER — CIPROFLOXACIN HCL 500 MG PO TABS
500.0000 mg | ORAL_TABLET | Freq: Two times a day (BID) | ORAL | Status: DC
  Administered 2016-03-22 – 2016-03-23 (×2): 500 mg via ORAL
  Filled 2016-03-22 (×2): qty 1

## 2016-03-22 MED ORDER — LACTATED RINGERS IV SOLN
INTRAVENOUS | Status: DC
  Administered 2016-03-22 (×2): via INTRAVENOUS
  Administered 2016-03-22: 125 mL/h via INTRAVENOUS

## 2016-03-22 MED ORDER — KETOROLAC TROMETHAMINE 30 MG/ML IJ SOLN
INTRAMUSCULAR | Status: AC
  Filled 2016-03-22: qty 1

## 2016-03-22 MED ORDER — CEFAZOLIN SODIUM-DEXTROSE 2-4 GM/100ML-% IV SOLN
2.0000 g | INTRAVENOUS | Status: AC
  Administered 2016-03-22: 2 g via INTRAVENOUS

## 2016-03-22 MED ORDER — LIDOCAINE HCL (CARDIAC) 20 MG/ML IV SOLN
INTRAVENOUS | Status: DC | PRN
  Administered 2016-03-22: 60 mg via INTRAVENOUS

## 2016-03-22 MED ORDER — ONDANSETRON HCL 4 MG/2ML IJ SOLN
INTRAMUSCULAR | Status: DC | PRN
  Administered 2016-03-22 (×2): 2 mg via INTRAVENOUS

## 2016-03-22 MED ORDER — STERILE WATER FOR IRRIGATION IR SOLN
Status: DC | PRN
  Administered 2016-03-22: 3000 mL via INTRAVESICAL

## 2016-03-22 MED ORDER — ONDANSETRON HCL 4 MG/2ML IJ SOLN
4.0000 mg | Freq: Four times a day (QID) | INTRAMUSCULAR | Status: DC | PRN
  Administered 2016-03-22: 4 mg via INTRAVENOUS
  Filled 2016-03-22: qty 2

## 2016-03-22 MED ORDER — SODIUM CHLORIDE 0.9 % IJ SOLN
INTRAMUSCULAR | Status: AC
  Filled 2016-03-22: qty 50

## 2016-03-22 MED ORDER — LACTATED RINGERS IR SOLN
Status: DC | PRN
  Administered 2016-03-22: 3000 mL

## 2016-03-22 MED ORDER — IBUPROFEN 600 MG PO TABS: 600.0000 mg | ORAL_TABLET | Freq: Four times a day (QID) | ORAL | Status: DC | PRN

## 2016-03-22 MED ORDER — NALOXONE HCL 0.4 MG/ML IJ SOLN: 0.4000 mg | INTRAMUSCULAR | Status: DC | PRN

## 2016-03-22 MED ORDER — MIDAZOLAM HCL 2 MG/2ML IJ SOLN
INTRAMUSCULAR | Status: AC
  Filled 2016-03-22: qty 2

## 2016-03-22 MED ORDER — SODIUM CHLORIDE 0.9% FLUSH: 9.0000 mL | INTRAVENOUS | Status: DC | PRN

## 2016-03-22 MED ORDER — MIDAZOLAM HCL 2 MG/2ML IJ SOLN
INTRAMUSCULAR | Status: DC | PRN
  Administered 2016-03-22: 1.5 mg via INTRAVENOUS
  Administered 2016-03-22: 0.5 mg via INTRAVENOUS

## 2016-03-22 MED ORDER — DEXAMETHASONE SODIUM PHOSPHATE 10 MG/ML IJ SOLN
INTRAMUSCULAR | Status: AC
  Filled 2016-03-22: qty 1

## 2016-03-22 MED ORDER — CYCLOBENZAPRINE HCL 10 MG PO TABS
10.0000 mg | ORAL_TABLET | Freq: Three times a day (TID) | ORAL | Status: DC | PRN
  Filled 2016-03-22: qty 1

## 2016-03-22 MED ORDER — VASOPRESSIN 20 UNIT/ML IV SOLN
INTRAVENOUS | Status: DC | PRN
  Administered 2016-03-22: 20 mL via INTRAMUSCULAR

## 2016-03-22 MED ORDER — HYDROMORPHONE HCL 1 MG/ML IJ SOLN
INTRAMUSCULAR | Status: AC
  Filled 2016-03-22: qty 2

## 2016-03-22 MED ORDER — DIPHENHYDRAMINE HCL 12.5 MG/5ML PO ELIX
12.5000 mg | ORAL_SOLUTION | Freq: Four times a day (QID) | ORAL | Status: DC | PRN
  Filled 2016-03-22: qty 5

## 2016-03-22 MED ORDER — NEOSTIGMINE METHYLSULFATE 10 MG/10ML IV SOLN
INTRAVENOUS | Status: DC | PRN
  Administered 2016-03-22: 4 mg via INTRAVENOUS

## 2016-03-22 MED ORDER — FENTANYL CITRATE (PF) 100 MCG/2ML IJ SOLN
INTRAMUSCULAR | Status: DC | PRN
  Administered 2016-03-22 (×2): 50 ug via INTRAVENOUS
  Administered 2016-03-22: 100 ug via INTRAVENOUS
  Administered 2016-03-22: 50 ug via INTRAVENOUS
  Administered 2016-03-22 (×2): 100 ug via INTRAVENOUS
  Administered 2016-03-22: 50 ug via INTRAVENOUS

## 2016-03-22 MED ORDER — HYDROMORPHONE HCL 1 MG/ML IJ SOLN
0.2500 mg | INTRAMUSCULAR | Status: DC | PRN
Start: 2016-03-22 — End: 2016-03-22
  Administered 2016-03-22: 0.35 mg via INTRAVENOUS
  Administered 2016-03-22: 0.3 mg via INTRAVENOUS
  Administered 2016-03-22 (×2): 0.5 mg via INTRAVENOUS
  Administered 2016-03-22: 0.35 mg via INTRAVENOUS

## 2016-03-22 MED ORDER — ONDANSETRON HCL 4 MG PO TABS: 4.0000 mg | ORAL_TABLET | Freq: Three times a day (TID) | ORAL | Status: DC | PRN

## 2016-03-22 MED ORDER — FENTANYL CITRATE (PF) 250 MCG/5ML IJ SOLN
INTRAMUSCULAR | Status: AC
  Filled 2016-03-22: qty 5

## 2016-03-22 MED ORDER — OXYCODONE-ACETAMINOPHEN 5-325 MG PO TABS
1.0000 | ORAL_TABLET | ORAL | Status: DC | PRN
  Administered 2016-03-23: 2 via ORAL
  Filled 2016-03-22: qty 2

## 2016-03-22 MED ORDER — KETOROLAC TROMETHAMINE 30 MG/ML IJ SOLN
30.0000 mg | Freq: Four times a day (QID) | INTRAMUSCULAR | Status: AC
  Administered 2016-03-22 – 2016-03-23 (×2): 30 mg via INTRAVENOUS
  Filled 2016-03-22 (×2): qty 1

## 2016-03-22 MED ORDER — ROCURONIUM BROMIDE 100 MG/10ML IV SOLN
INTRAVENOUS | Status: DC | PRN
  Administered 2016-03-22: 10 mg via INTRAVENOUS
  Administered 2016-03-22: 50 mg via INTRAVENOUS
  Administered 2016-03-22 (×2): 10 mg via INTRAVENOUS

## 2016-03-22 MED ORDER — METHYLENE BLUE 0.5 % INJ SOLN
INTRAVENOUS | Status: DC | PRN
  Administered 2016-03-22: 25 mg via INTRAVENOUS

## 2016-03-22 MED ORDER — PREGABALIN 100 MG PO CAPS
100.0000 mg | ORAL_CAPSULE | Freq: Two times a day (BID) | ORAL | Status: DC
Start: 2016-03-23 — End: 2016-03-23
  Administered 2016-03-23: 100 mg via ORAL
  Filled 2016-03-22: qty 1

## 2016-03-22 MED ORDER — KETOROLAC TROMETHAMINE 30 MG/ML IJ SOLN: 30.0000 mg | Freq: Once | INTRAMUSCULAR | Status: DC | PRN

## 2016-03-22 MED ORDER — METHYLENE BLUE 0.5 % INJ SOLN
INTRAVENOUS | Status: AC
  Filled 2016-03-22: qty 10

## 2016-03-22 MED ORDER — SCOPOLAMINE 1 MG/3DAYS TD PT72
1.0000 | MEDICATED_PATCH | Freq: Once | TRANSDERMAL | Status: DC
  Administered 2016-03-22: 1.5 mg via TRANSDERMAL

## 2016-03-22 MED ORDER — DIPHENHYDRAMINE HCL 50 MG/ML IJ SOLN: 12.5000 mg | Freq: Four times a day (QID) | INTRAMUSCULAR | Status: DC | PRN

## 2016-03-22 MED ORDER — HYDROMORPHONE HCL 1 MG/ML IJ SOLN
0.5000 mg | INTRAMUSCULAR | Status: DC | PRN
  Administered 2016-03-22 (×2): 0.5 mg via INTRAVENOUS

## 2016-03-22 MED ORDER — HYDROMORPHONE 1 MG/ML IV SOLN
INTRAVENOUS | Status: DC
  Administered 2016-03-22: 2.7 mg via INTRAVENOUS
  Administered 2016-03-22: 15:00:00 via INTRAVENOUS
  Administered 2016-03-22: 2.7 mL via INTRAVENOUS
  Administered 2016-03-23: 2.4 mg via INTRAVENOUS
  Administered 2016-03-23: 1.8 mg via INTRAVENOUS
  Filled 2016-03-22: qty 25

## 2016-03-22 MED ORDER — ONDANSETRON HCL 4 MG/2ML IJ SOLN
INTRAMUSCULAR | Status: AC
  Filled 2016-03-22: qty 2

## 2016-03-22 MED ORDER — LIDOCAINE HCL (CARDIAC) 20 MG/ML IV SOLN
INTRAVENOUS | Status: AC
  Filled 2016-03-22: qty 5

## 2016-03-22 MED ORDER — ROCURONIUM BROMIDE 100 MG/10ML IV SOLN
INTRAVENOUS | Status: AC
  Filled 2016-03-22: qty 1

## 2016-03-22 MED ORDER — HYDROMORPHONE HCL 1 MG/ML IJ SOLN
INTRAMUSCULAR | Status: DC | PRN
  Administered 2016-03-22: 2 mg via INTRAVENOUS

## 2016-03-22 MED ORDER — BUPIVACAINE HCL (PF) 0.25 % IJ SOLN
INTRAMUSCULAR | Status: AC
  Filled 2016-03-22: qty 30

## 2016-03-22 MED ORDER — KETOROLAC TROMETHAMINE 30 MG/ML IJ SOLN
INTRAMUSCULAR | Status: DC | PRN
  Administered 2016-03-22: 30 mg via INTRAVENOUS

## 2016-03-22 MED ORDER — SCOPOLAMINE 1 MG/3DAYS TD PT72
MEDICATED_PATCH | TRANSDERMAL | Status: AC
  Administered 2016-03-22: 1.5 mg via TRANSDERMAL
  Filled 2016-03-22: qty 1

## 2016-03-22 MED ORDER — HYDROMORPHONE HCL 1 MG/ML IJ SOLN
INTRAMUSCULAR | Status: AC
  Administered 2016-03-22: 0.3 mg via INTRAVENOUS
  Filled 2016-03-22: qty 1

## 2016-03-22 MED ORDER — PROPOFOL 10 MG/ML IV BOLUS
INTRAVENOUS | Status: AC
  Filled 2016-03-22: qty 20

## 2016-03-22 MED ORDER — DEXAMETHASONE SODIUM PHOSPHATE 10 MG/ML IJ SOLN
INTRAMUSCULAR | Status: DC | PRN
  Administered 2016-03-22: 10 mg via INTRAVENOUS

## 2016-03-22 MED ORDER — HYDROMORPHONE HCL 1 MG/ML IJ SOLN
INTRAMUSCULAR | Status: AC
  Administered 2016-03-22: 0.5 mg via INTRAVENOUS
  Filled 2016-03-22: qty 1

## 2016-03-22 MED ORDER — MENTHOL 3 MG MT LOZG
1.0000 | LOZENGE | OROMUCOSAL | Status: DC | PRN
  Administered 2016-03-23: 3 mg via ORAL
  Filled 2016-03-22: qty 9

## 2016-03-22 MED ORDER — PROMETHAZINE HCL 25 MG/ML IJ SOLN
6.2500 mg | INTRAMUSCULAR | Status: DC | PRN
Start: 2016-03-22 — End: 2016-03-22

## 2016-03-22 MED ORDER — ACETAMINOPHEN 10 MG/ML IV SOLN
1000.0000 mg | Freq: Once | INTRAVENOUS | Status: AC
  Administered 2016-03-22: 1000 mg via INTRAVENOUS
  Filled 2016-03-22: qty 100

## 2016-03-22 MED ORDER — DOCUSATE SODIUM 100 MG PO CAPS
100.0000 mg | ORAL_CAPSULE | Freq: Two times a day (BID) | ORAL | Status: DC
  Administered 2016-03-23: 100 mg via ORAL
  Filled 2016-03-22: qty 1

## 2016-03-22 MED ORDER — PROPOFOL 10 MG/ML IV BOLUS
INTRAVENOUS | Status: DC | PRN
  Administered 2016-03-22: 150 mg via INTRAVENOUS

## 2016-03-22 MED ORDER — LACTATED RINGERS IV SOLN
INTRAVENOUS | Status: DC
  Administered 2016-03-22: 22:00:00 via INTRAVENOUS

## 2016-03-22 MED ORDER — GLYCOPYRROLATE 0.2 MG/ML IJ SOLN
INTRAMUSCULAR | Status: DC | PRN
  Administered 2016-03-22 (×2): .1 mg via INTRAVENOUS
  Administered 2016-03-22: .6 mg via INTRAVENOUS

## 2016-03-22 MED ORDER — VASOPRESSIN 20 UNIT/ML IV SOLN
INTRAVENOUS | Status: AC
  Filled 2016-03-22: qty 1

## 2016-03-22 MED ORDER — BUPIVACAINE HCL (PF) 0.25 % IJ SOLN
INTRAMUSCULAR | Status: DC | PRN
  Administered 2016-03-22: 8 mL

## 2016-03-22 MED ORDER — AMPHETAMINE-DEXTROAMPHETAMINE 10 MG PO TABS
20.0000 mg | ORAL_TABLET | Freq: Two times a day (BID) | ORAL | Status: DC
  Administered 2016-03-23: 20 mg via ORAL
  Filled 2016-03-22: qty 2
  Filled 2016-03-22: qty 1

## 2016-03-22 MED ORDER — NEOSTIGMINE METHYLSULFATE 10 MG/10ML IV SOLN
INTRAVENOUS | Status: AC
  Filled 2016-03-22: qty 1

## 2016-03-22 MED ORDER — GLYCOPYRROLATE 0.2 MG/ML IJ SOLN
INTRAMUSCULAR | Status: AC
Start: 2016-03-22 — End: 2016-03-22
  Filled 2016-03-22: qty 3

## 2016-03-22 SURGICAL SUPPLY — 85 items
APPLICATOR ARISTA FLEXITIP XL (MISCELLANEOUS) ×5 IMPLANT
CABLE HIGH FREQUENCY MONO STRZ (ELECTRODE) IMPLANT
CANISTER SUCT 3000ML (MISCELLANEOUS) ×5 IMPLANT
CATH FOLEY 3WAY  5CC 16FR (CATHETERS)
CATH FOLEY 3WAY 5CC 16FR (CATHETERS) IMPLANT
CLOSURE WOUND 1/2 X4 (GAUZE/BANDAGES/DRESSINGS)
CLOTH BEACON ORANGE TIMEOUT ST (SAFETY) ×5 IMPLANT
CONT PATH 16OZ SNAP LID 3702 (MISCELLANEOUS) ×5 IMPLANT
COVER BACK TABLE 60X90IN (DRAPES) ×5 IMPLANT
COVER LIGHT HANDLE  1/PK (MISCELLANEOUS) ×6
COVER LIGHT HANDLE 1/PK (MISCELLANEOUS) ×9 IMPLANT
COVER MAYO STAND STRL (DRAPES) ×5 IMPLANT
DECANTER SPIKE VIAL GLASS SM (MISCELLANEOUS) ×15 IMPLANT
DERMABOND ADVANCED (GAUZE/BANDAGES/DRESSINGS) ×2
DERMABOND ADVANCED .7 DNX12 (GAUZE/BANDAGES/DRESSINGS) ×3 IMPLANT
DISSECTOR SPONGE CHERRY (GAUZE/BANDAGES/DRESSINGS) IMPLANT
DRAPE POUCH INSTRU U-SHP 10X18 (DRAPES) ×5 IMPLANT
DRAPE SHEET LG 3/4 BI-LAMINATE (DRAPES) ×10 IMPLANT
DRAPE WARM FLUID 44X44 (DRAPE) IMPLANT
DRSG OPSITE POSTOP 3X4 (GAUZE/BANDAGES/DRESSINGS) ×5 IMPLANT
DRSG OPSITE POSTOP 4X10 (GAUZE/BANDAGES/DRESSINGS) IMPLANT
DURAPREP 26ML APPLICATOR (WOUND CARE) ×5 IMPLANT
ELECT REM PT RETURN 9FT ADLT (ELECTROSURGICAL) ×5
ELECTRODE REM PT RTRN 9FT ADLT (ELECTROSURGICAL) ×3 IMPLANT
FILTER SMOKE EVAC LAPAROSHD (FILTER) IMPLANT
FORCEPS CUTTING 33CM 5MM (CUTTING FORCEPS) ×5 IMPLANT
GAUZE PACKING 2X5 YD STRL (GAUZE/BANDAGES/DRESSINGS) IMPLANT
GAUZE SPONGE 4X4 16PLY XRAY LF (GAUZE/BANDAGES/DRESSINGS) IMPLANT
GAUZE VASELINE 3X9 (GAUZE/BANDAGES/DRESSINGS) IMPLANT
GLOVE BIO SURGEON STRL SZ 6.5 (GLOVE) ×8 IMPLANT
GLOVE BIO SURGEONS STRL SZ 6.5 (GLOVE) ×2
GLOVE BIOGEL PI IND STRL 6.5 (GLOVE) ×3 IMPLANT
GLOVE BIOGEL PI IND STRL 7.0 (GLOVE) ×12 IMPLANT
GLOVE BIOGEL PI INDICATOR 6.5 (GLOVE) ×2
GLOVE BIOGEL PI INDICATOR 7.0 (GLOVE) ×8
GOWN STRL REUS W/TWL LRG LVL3 (GOWN DISPOSABLE) ×10 IMPLANT
HEMOSTAT ARISTA ABSORB 1G (MISCELLANEOUS) ×5 IMPLANT
LEGGING LITHOTOMY PAIR STRL (DRAPES) ×5 IMPLANT
NEEDLE HYPO 22GX1.5 SAFETY (NEEDLE) IMPLANT
NEEDLE MAYO CATGUT SZ4 (NEEDLE) ×5 IMPLANT
NS IRRIG 1000ML POUR BTL (IV SOLUTION) ×5 IMPLANT
PACK LAVH (CUSTOM PROCEDURE TRAY) ×5 IMPLANT
PACK ROBOTIC GOWN (GOWN DISPOSABLE) ×5 IMPLANT
PACK TRENDGUARD 450 HYBRID PRO (MISCELLANEOUS) ×3 IMPLANT
PACK TRENDGUARD 600 HYBRD PROC (MISCELLANEOUS) IMPLANT
PAD OB MATERNITY 4.3X12.25 (PERSONAL CARE ITEMS) ×5 IMPLANT
PENCIL SMOKE EVAC W/HOLSTER (ELECTROSURGICAL) IMPLANT
PROTECTOR NERVE ULNAR (MISCELLANEOUS) ×10 IMPLANT
SET CYSTO W/LG BORE CLAMP LF (SET/KITS/TRAYS/PACK) ×5 IMPLANT
SET IRRIG TUBING LAPAROSCOPIC (IRRIGATION / IRRIGATOR) ×5 IMPLANT
SHEARS HARMONIC ACE PLUS 36CM (ENDOMECHANICALS) IMPLANT
SHEET LAVH (DRAPES) IMPLANT
SLEEVE XCEL OPT CAN 5 100 (ENDOMECHANICALS) ×5 IMPLANT
SOLUTION ELECTROLUBE (MISCELLANEOUS) ×5 IMPLANT
SPONGE LAP 18X18 X RAY DECT (DISPOSABLE) IMPLANT
SPONGE SURGIFOAM ABS GEL 12-7 (HEMOSTASIS) ×5 IMPLANT
STAPLER VISISTAT 35W (STAPLE) IMPLANT
STRIP CLOSURE SKIN 1/2X4 (GAUZE/BANDAGES/DRESSINGS) IMPLANT
SUT CHROMIC 0 CT 1 (SUTURE) ×5 IMPLANT
SUT MNCRL AB 3-0 PS2 27 (SUTURE) ×10 IMPLANT
SUT MON AB 3-0 SH 27 (SUTURE)
SUT MON AB 3-0 SH27 (SUTURE) IMPLANT
SUT PDS AB 0 CT1 27 (SUTURE) IMPLANT
SUT PLAIN 2 0 XLH (SUTURE) IMPLANT
SUT VIC AB 0 CT1 18XCR BRD8 (SUTURE) ×6 IMPLANT
SUT VIC AB 0 CT1 27 (SUTURE) ×4
SUT VIC AB 0 CT1 27XBRD ANBCTR (SUTURE) ×6 IMPLANT
SUT VIC AB 0 CT1 36 (SUTURE) ×5 IMPLANT
SUT VIC AB 0 CT1 8-18 (SUTURE) ×4
SUT VIC AB 2-0 SH 27 (SUTURE)
SUT VIC AB 2-0 SH 27XBRD (SUTURE) IMPLANT
SUT VICRYL 0 TIES 12 18 (SUTURE) ×5 IMPLANT
SUT VICRYL 0 UR6 27IN ABS (SUTURE) ×10 IMPLANT
SYR 50ML LL SCALE MARK (SYRINGE) IMPLANT
SYR BULB IRRIGATION 50ML (SYRINGE) ×5 IMPLANT
SYR CONTROL 10ML LL (SYRINGE) IMPLANT
SYR TB 1ML LUER SLIP (SYRINGE) ×5 IMPLANT
TOWEL OR 17X24 6PK STRL BLUE (TOWEL DISPOSABLE) ×10 IMPLANT
TRAY FOLEY CATH SILVER 14FR (SET/KITS/TRAYS/PACK) ×5 IMPLANT
TRENDGUARD 450 HYBRID PRO PACK (MISCELLANEOUS) ×5
TRENDGUARD 600 HYBRID PROC PK (MISCELLANEOUS)
TROCAR BALLN 12MMX100 BLUNT (TROCAR) ×5 IMPLANT
TROCAR XCEL NON-BLD 5MMX100MML (ENDOMECHANICALS) ×5 IMPLANT
TUBING FILTER THERMOFLATOR (ELECTROSURGICAL) IMPLANT
WATER STERILE IRR 1000ML POUR (IV SOLUTION) IMPLANT

## 2016-03-22 NOTE — Addendum Note (Signed)
Addendum  created 03/22/16 1912 by Jonna Munro, CRNA   Sign clinical note

## 2016-03-22 NOTE — Progress Notes (Signed)
Pt calls out weakly 'hurt, hurt, hurt' with eyes closed. Then back to sleep. resp shallow. Rate 9-11. Emotional support given.

## 2016-03-22 NOTE — Anesthesia Preprocedure Evaluation (Signed)
Anesthesia Evaluation  Patient identified by MRN, date of birth, ID band Patient awake    Reviewed: Allergy & Precautions, NPO status , Patient's Chart, lab work & pertinent test results  Airway Mallampati: II  TM Distance: >3 FB Neck ROM: Full    Dental  (+) Dental Advisory Given   Pulmonary former smoker,    breath sounds clear to auscultation       Cardiovascular negative cardio ROS   Rhythm:Regular Rate:Normal     Neuro/Psych negative neurological ROS     GI/Hepatic Neg liver ROS, GERD  ,Hx gastric bypass   Endo/Other  negative endocrine ROS  Renal/GU      Musculoskeletal  (+) Fibromyalgia -  Abdominal   Peds  Hematology  (+) anemia , Hx anemia requiring iron infusions and blood transfusions   Anesthesia Other Findings   Reproductive/Obstetrics                             Lab Results  Component Value Date   WBC 10.3 03/15/2016   HGB 13.2 03/15/2016   HCT 38.1 03/15/2016   MCV 89.0 03/15/2016   PLT 296 03/15/2016   Lab Results  Component Value Date   CREATININE 0.6 10/16/2010   BUN 10 10/16/2010   NA 140 10/16/2010   K 4.2 10/16/2010   CL 105 10/16/2010   CO2 29 10/16/2010  '  Anesthesia Physical Anesthesia Plan  ASA: II  Anesthesia Plan: General   Post-op Pain Management:    Induction: Intravenous  Airway Management Planned: Oral ETT  Additional Equipment:   Intra-op Plan:   Post-operative Plan: Extubation in OR  Informed Consent: I have reviewed the patients History and Physical, chart, labs and discussed the procedure including the risks, benefits and alternatives for the proposed anesthesia with the patient or authorized representative who has indicated his/her understanding and acceptance.   Dental advisory given  Plan Discussed with:   Anesthesia Plan Comments:         Anesthesia Quick Evaluation

## 2016-03-22 NOTE — Interval H&P Note (Signed)
History and Physical Interval Note:  03/22/2016 8:23 AM  Diane Gibbs  has presented today for surgery, with the diagnosis of Menometrorrhagia, Anemia, Fibromyalgia  The various methods of treatment have been discussed with the patient and family. After consideration of risks, benefits and other options for treatment, the patient has consented to  Procedure(s): LAPAROSCOPIC ASSISTED VAGINAL HYSTERECTOMY WITH SALPINGECTOMY (Bilateral) HYSTERECTOMY ABDOMINAL WITH SALPINGECTOMY (Bilateral) CYSTOSCOPY (N/A) as a surgical intervention .  The patient's history has been reviewed, patient examined, no change in status, stable for surgery.  I have reviewed the patient's chart and labs.  Questions were answered to the patient's satisfaction.     [facility]

## 2016-03-22 NOTE — Anesthesia Postprocedure Evaluation (Signed)
Anesthesia Post Note  Patient: Diane Gibbs  Procedure(s) Performed: Procedure(s) (LRB): LAPAROSCOPIC ASSISTED VAGINAL HYSTERECTOMY WITH SALPINGECTOMY (Bilateral) CYSTOSCOPY (N/A)  Patient location during evaluation: PACU Anesthesia Type: General Level of consciousness: awake and alert Pain management: pain level controlled Vital Signs Assessment: post-procedure vital signs reviewed and stable Respiratory status: spontaneous breathing, nonlabored ventilation, respiratory function stable and patient connected to nasal cannula oxygen Cardiovascular status: blood pressure returned to baseline and stable Postop Assessment: no signs of nausea or vomiting Anesthetic complications: no        Last Vitals:  Vitals:   03/22/16 1400 03/22/16 1430  BP: (!) 105/57 (!) 124/54  Pulse: 67   Resp: 17   Temp:  36.5 C    Last Pain:  Vitals:   03/22/16 1345  TempSrc:   PainSc: 10-Worst pain ever   Pain Goal: Patients Stated Pain Goal: 3 (03/22/16 0718)               Tiajuana Amass

## 2016-03-22 NOTE — Progress Notes (Signed)
Diane Gibbs is a68 y.o.  [id number]  Day of Surgery:  NOTE TO PHARMACY:  Cipro 500 mg bid x 3 days has been prescribe for this patient, per Dr. Charlesetta Garibaldi, due to bladder instrumentation during surgery.  [name], PA-C 03/22/2016 12:11 PM

## 2016-03-22 NOTE — Progress Notes (Signed)
Dr Ola Spurr notified of continued level 10 pain. Orders received for additional Dilaudid.

## 2016-03-22 NOTE — Anesthesia Postprocedure Evaluation (Signed)
Anesthesia Post Note  Patient: Diane Gibbs  Procedure(s) Performed: Procedure(s) (LRB): LAPAROSCOPIC ASSISTED VAGINAL HYSTERECTOMY WITH SALPINGECTOMY (Bilateral) CYSTOSCOPY (N/A)  Patient location during evaluation: [facility] Anesthesia Type: General Level of consciousness: awake, awake and alert and oriented Pain management: pain level controlled Vital Signs Assessment: post-procedure vital signs reviewed and stable Respiratory status: spontaneous breathing, nonlabored ventilation, respiratory function stable and patient connected to nasal cannula oxygen Cardiovascular status: stable Postop Assessment: adequate PO intake and no signs of nausea or vomiting Anesthetic complications: no        Last Vitals:  Vitals:   03/22/16 1735 03/22/16 1805  BP: (!) 110/48   Pulse: 79   Resp:  15  Temp:      Last Pain:  Vitals:   03/22/16 1805  TempSrc:   PainSc: 4    Pain Goal: Patients Stated Pain Goal: 4 (03/22/16 1537)               Willa Rough

## 2016-03-22 NOTE — Progress Notes (Signed)
Day of Surgery Procedure(s) (LRB): LAPAROSCOPIC ASSISTED VAGINAL HYSTERECTOMY WITH SALPINGECTOMY (Bilateral) CYSTOSCOPY (N/A)  Subjective: Patient reports incisional pain and tolerating PO.    Objective: I have reviewed patient's vital signs, intake and output and medications.  General: no distress and sleepy Resp: clear to auscultation bilaterally Cardio: regular rate and rhythm GI: soft, non-tender; bowel sounds normal; no masses,  no organomegaly and incision: clean, dry and intact Extremities: Homans sign is negative, no sign of DVT Vaginal Bleeding: minimal  Assessment: s/p Procedure(s): LAPAROSCOPIC ASSISTED VAGINAL HYSTERECTOMY WITH SALPINGECTOMY (Bilateral) CYSTOSCOPY (N/A): stable  Plan: Advance diet Encourage ambulation  LOS: 0 days    German Manke A 03/22/2016, 5:59 PM

## 2016-03-22 NOTE — Anesthesia Procedure Notes (Signed)
Procedure Name: Intubation Date/Time: 03/22/2016 8:30 AM Performed by: Suzette Battiest Pre-anesthesia Checklist: Patient identified, Patient being monitored, Timeout performed, Emergency Drugs available and Suction available Patient Re-evaluated:Patient Re-evaluated prior to inductionOxygen Delivery Method: Circle System Utilized Preoxygenation: Pre-oxygenation with 100% oxygen Intubation Type: IV induction Ventilation: Mask ventilation without difficulty Laryngoscope Size: Miller and 2 Grade View: Grade II Tube type: Oral Tube size: 7.0 mm Number of attempts: 1 Airway Equipment and Method: stylet Placement Confirmation: ETT inserted through vocal cords under direct vision,  positive ETCO2 and breath sounds checked- equal and bilateral Secured at: 22 cm Tube secured with: Tape Dental Injury: Teeth and Oropharynx as per pre-operative assessment

## 2016-03-22 NOTE — Op Note (Signed)
reop Diagnosis: Symptomatic Fibroids, 58570, poss. [id number]   Postop Diagnosis: menorrhagia  Procedure: LAVH, B salpingectmy LOA, Cystoscopy, right ovarian cystectomy  Anesthesia: General   Anesthesiologist: Dr Royce Macadamia  Attending: Betsy Coder, MD   Assistant: Earnstine Regal PA  Findings: uterus 9 week size with fibroids and some scar tissue along the anterior abdominal wall and left side of the anterior culdesac.  Right simple ovarian cyst.  Small simple adhesion from the liver to the abdominal wall  Pathology: uterus and cervix and bilateral tubes. Left tubal cyst and right ovarian cyst wall  Fluids: 2500 cccrystalloid  UOP: 200cc  EBL: [id number]  Complications:none  Procedure: The patient was taken to the operating room, placed under general anesthesia and prepped and draped in the normal sterile fashion. A Foley catheter was placed in the bladde. A weighted speculum and vaginal retractors were placed in the vagina. Tenaculum was placed on the anterior lip of the cervix.  A hulka manipulator was placed in the uterus. Attention was then turned to the abdomen. A 10 mm infraumbilical incision was made with the scalpel after 5 cc of 25% percent Marcaine was used for local anesthesia. The subcutaneous tissue was dissected and the fascia was incised with the knife. She had a larger pannus than expected and the dissection took londer than expected.   A purse string stitch was placed in the fascia and Hassan placed into the intra-abdominal cavity and anchored to the suture. Intraabdominal placement was confirmed with the laparoscope.  Two 5 mm trochars were placed in the right and left lower quadrants under direct visualization with the laparoscope. Both fallopian tubes were removed with the gyrus and placed in the culdesac.  The left tube had a simple cyst on it.     .   Both round ligaments were cauterized and cut with the gyrus bipolar cautery as well and the bladder flap created with the tripolar  and removed away from the uterus.  The right utero-ovarian ligament was cauterized and cut with the tripolar cautery gyrus.  Attention was then turned to the vagina.  A weighted speculum was placed in the posterior fourchette.  petrussin mixture was placed circumferentially around the cervix.  With blunt and sharp dissection the cervix was dissected away from the bowel and bladder. Fat was seen as I entered the posterior culdesac.   Both uterosacral ligaments were clamped, cut and suture ligated and held.  The anterior and posterior culdesac was entered sharply using metzenbaum scissors.  The cardinal ligaments and  The uterine arteries were clamped, cut and suture liga  The uterus was then  delivered.  The mcall suture was placed.   The vaginal cuff was closed with interrupted suture of 0 vicryl.   the patient was given metheline blue.    Cystoscopy was performed and both ureters were seen to efflux urine without difficulty. The bladder had full integrity with no suture or laceration visualized.  The vagina was inspected and the cuff was noted to be intact.  Attention was then turned back to the abdomen after removing top pair of gloves. The abdomen was reinsufflated with CO2 gas.   The abdomen and pelvis was copiously irrigated.  There was some bleeding from left  Ovary which was made hemostatic with cautery.      Surgicel  was placed along the cuff.  There was oozing all along the posterior peritoneum and posterioer culdesac.  The bowel was intact.  Aristat was used and hemoststasis was achieved.  All trochars were removed under direct visualization using the laparoscope.  The umbilical fascia was reapproximated by tying the circumferential suture. The two 5 mm incisions were closed with 3-0 Monocryl via a subcuticular stitch.  All remaining skin incisions were closed with Dermabond and the 10 mm skin incisions were reinforced using Dermabond.  Sponge lap and needle counts were correct.  The patient tolerated  the procedure well and was returned to the PACU in stable condition .  A rectal exam was done because of the fat seen when I entered the posterior peritoeum.  The rectum had good integrity and tone

## 2016-03-22 NOTE — Transfer of Care (Signed)
Immediate Anesthesia Transfer of Care Note  Patient: Diane Gibbs  Procedure(s) Performed: Procedure(s): LAPAROSCOPIC ASSISTED VAGINAL HYSTERECTOMY WITH SALPINGECTOMY (Bilateral) CYSTOSCOPY (N/A)  Patient Location: PACU  Anesthesia Type:General  Level of Consciousness: awake, alert  and oriented  Airway & Oxygen Therapy: Patient Spontanous Breathing and Patient connected to face mask oxygen  Post-op Assessment: Report given to RN, Post -op Vital signs reviewed and stable and Patient moving all extremities X 4  Post vital signs: Reviewed and stable  Last Vitals:  Vitals:   03/22/16 0718  BP: (!) 116/55  Pulse: 79  Resp: 20  Temp: 36.8 C    Last Pain:  Vitals:   03/22/16 0718  TempSrc: Oral  PainSc: 6       Patients Stated Pain Goal: 3 ([id number])  Complications: No apparent anesthesia complications

## 2016-03-23 ENCOUNTER — Encounter (HOSPITAL_COMMUNITY): Payer: Self-pay | Admitting: Obstetrics and Gynecology

## 2016-03-23 DIAGNOSIS — N920 Excessive and frequent menstruation with regular cycle: Secondary | ICD-10-CM | POA: Diagnosis not present

## 2016-03-23 LAB — CBC
HCT: 30.5 % — ABNORMAL LOW (ref 36.0–46.0)
Hemoglobin: 10.5 g/dL — ABNORMAL LOW (ref 12.0–15.0)
MCH: 30.7 pg (ref 26.0–34.0)
MCHC: 34.4 g/dL (ref 30.0–36.0)
MCV: 89.2 fL (ref 78.0–100.0)
Platelets: 248 K/uL (ref 150–400)
RBC: 3.42 MIL/uL — ABNORMAL LOW (ref 3.87–5.11)
RDW: 12.7 % (ref 11.5–15.5)
WBC: 18 K/uL — ABNORMAL HIGH (ref 4.0–10.5)

## 2016-03-23 MED ORDER — OXYCODONE-ACETAMINOPHEN 5-325 MG PO TABS: 1.0000 | ORAL_TABLET | Freq: Four times a day (QID) | ORAL | 0 refills | Status: DC | PRN

## 2016-03-23 MED ORDER — CIPROFLOXACIN HCL 500 MG PO TABS: 500.0000 mg | ORAL_TABLET | Freq: Two times a day (BID) | ORAL | 0 refills | Status: DC

## 2016-03-23 MED ORDER — ENSURE ENLIVE PO LIQD
237.0000 mL | Freq: Two times a day (BID) | ORAL | Status: DC
  Filled 2016-03-23 (×2): qty 237

## 2016-03-23 MED ORDER — IBUPROFEN 600 MG PO TABS: ORAL_TABLET | ORAL | 1 refills | Status: DC

## 2016-03-23 MED ORDER — DIPHENHYDRAMINE HCL 25 MG PO CAPS
25.0000 mg | ORAL_CAPSULE | Freq: Once | ORAL | Status: AC
Start: 2016-03-23 — End: 2016-03-23
  Administered 2016-03-23: 25 mg via ORAL
  Filled 2016-03-23: qty 1

## 2016-03-23 NOTE — Discharge Instructions (Signed)
Total Laparoscopic Hysterectomy A total laparoscopic hysterectomy is a minimally invasive surgery to remove your uterus and cervix. This surgery is performed by making several small cuts (incisions) in your abdomen. It can also be done with a thin, lighted tube (laparoscope) inserted into two small incisions in your lower abdomen. Your fallopian tubes and ovaries can be removed (bilateral salpingo-oophorectomy) during this surgery as well.Benefits of minimally invasive surgery include:  Less pain.  Less risk of blood loss.  Less risk of infection.  Quicker return to normal activities. Tell a health care provider about:  Any allergies you have.  All medicines you are taking, including vitamins, herbs, eye drops, creams, and over-the-counter medicines.  Any problems you or family members have had with anesthetic medicines.  Any blood disorders you have.  Any surgeries you have had.  Any medical conditions you have. What are the risks? Generally, this is a safe procedure. However, as with any procedure, complications can occur. Possible complications include:  Bleeding.  Blood clots in the legs or lung.  Infection.  Injury to surrounding organs.  Problems with anesthesia.  Early menopause symptoms (hot flashes, night sweats, insomnia).  Risk of conversion to an open abdominal incision. What happens before the procedure?  Ask your health care provider about changing or stopping your regular medicines.  Do not take aspirin or blood thinners (anticoagulants) for 1 week before the surgery or as told by your health care provider.  Do not eat or drink anything for 8 hours before the surgery or as told by your health care provider.  Quit smoking if you smoke.  Arrange for a ride home after surgery and for someone to help you at home during recovery. What happens during the procedure?  You will be given antibiotic medicine.  An IV tube will be placed in your arm. You will  be given medicine to make you sleep (general anesthetic).  A gas (carbon dioxide) will be used to inflate your abdomen. This will allow your surgeon to look inside your abdomen, perform your surgery, and treat any other problems found if necessary.  Three or four small incisions (often less than 1/2 inch) will be made in your abdomen. One of these incisions will be made in the area of your belly button (navel). The laparoscope will be inserted into the incision. Your surgeon will look through the laparoscope while doing your procedure.  Other surgical instruments will be inserted through the other incisions.  Your uterus may be removed through your vagina or cut into small pieces and removed through the small incisions.  Your incisions will be closed. What happens after the procedure?  The gas will be released from inside your abdomen.  You will be taken to the recovery area where a nurse will watch and check your progress. Once you are awake, stable, and taking fluids well, without other problems, you will return to your room or be allowed to go home.  There is usually minimal discomfort following the surgery because the incisions are so small.  You will be given pain medicine while you are in the hospital and for when you go home. This information is not intended to replace advice given to you by your health care provider. Make sure you discuss any questions you have with your health care provider. Document Released: 11/22/2006 Document Revised: 07/03/2015 Document Reviewed: 08/15/2012 Elsevier Interactive Patient Education  2017 [facility] @ [phone] if:  You have a temperature greater than or  equal to 100.4 degrees Farenheit orally You have pain that is not made better by the pain medication given and taken as directed You have excessive bleeding or problems urinating  Take Colace (Docusate Sodium/Stool Softener) 100 mg 2-3 times daily while  taking narcotic pain medicine to avoid constipation or until bowel movements are regular. Take Ibuprofen 600 mg with food every 6 hours for 5 days then as needed for pain   You may drive after 2 weeks You may walk up steps  You may shower  You may resume a regular diet  Keep incisions clean and dry; remove umbilical dressing on [id number]. Do not lift over 15 pounds for 6 weeks Avoid anything in vagina for 6 weeks (or until after your post-operative visit)

## 2016-03-23 NOTE — Progress Notes (Signed)
Pt discharged to home with husband.  Condition stable.  Pt ambulated to car with B. Martin, NT.  No equipment for home ordered at discharge. 

## 2016-03-23 NOTE — Discharge Summary (Signed)
Physician Discharge Summary  Patient ID: Diane Gibbs MRN: [id number] DOB/AGE: 1965-10-24 51 y.o.  Admit date: 03/22/2016 Discharge date: 03/23/2016   Discharge Diagnoses: Menorrhagia and Symptomatic Uterine Fibroids Active Problems:   Menorrhagia   Operation: Laparoscopically Assisted Vaginal Hysterectomy with Bilateral Salpingectomy   Discharged Condition: Good  Hospital Course: On the date of admission the patient underwent the aforementioned procedures and tolerated them well.  Post operative course was unremarkable with the patient resuming bowel and bladder function by post operative day #1 and was therefore deemed ready for discharge home.  Discharge hemoglobin was 10.5.  Disposition: 01-Home or Self Care  Discharge Medications:  Allergies as of 03/23/2016   No Known Allergies     Medication List    STOP taking these medications   traMADol 50 MG tablet Commonly known as:  ULTRAM     TAKE these medications   ALPRAZolam 1 MG tablet Commonly known as:  XANAX Take 1 mg by mouth at bedtime.   amphetamine-dextroamphetamine 20 MG tablet Commonly known as:  ADDERALL Take 20 mg by mouth 2 (two) times daily.   ciprofloxacin 500 MG tablet Commonly known as:  CIPRO Take 1 tablet (500 mg total) by mouth 2 (two) times daily.   cyanocobalamin 1000 MCG/ML injection Commonly known as:  (VITAMIN B-12) Inject 1,000 mcg into the muscle every 28 (twenty-eight) days.   cyclobenzaprine 10 MG tablet Commonly known as:  FLEXERIL Take 10 mg by mouth 3 (three) times daily as needed for muscle spasms.   ergocalciferol 50000 units capsule Commonly known as:  VITAMIN D2 Take 1 capsule (50,000 Units total) by mouth once a week.   eszopiclone 3 MG Tabs Generic drug:  Eszopiclone Take 3 mg by mouth at bedtime. Take immediately before bedtime   ibuprofen 600 MG tablet Commonly known as:  ADVIL,MOTRIN 1  po  pc every 6 hours for 5 days then prn-pain   METROLOTION 0.75 %  Lotn Generic drug:  METRONIDAZOLE (TOPICAL) Apply 1 application topically 2 (two) times daily as needed (for rosacea).   oxyCODONE-acetaminophen 5-325 MG tablet Commonly known as:  PERCOCET/ROXICET Take 1-2 tablets by mouth every 6 (six) hours as needed for severe pain (moderate to severe pain (when tolerating fluids)).   pregabalin 100 MG capsule Commonly known as:  LYRICA Take 100 mg by mouth 2 (two) times daily.         Follow-up: Dr. Gwynneth Munson A. Dillard on May 03, 2016 at 1:45 p.m.   SignedEarnstine Regal, PA-C 03/23/2016, 7:59 AM

## 2016-03-23 NOTE — Progress Notes (Signed)
Diane Gibbs is a47 y.o.  [id number]  Post Op Date # 1:  LAVH/BS  Subjective: Patient is Doing well postoperatively. Patient has Pain is controlled with current analgesics. Medications being used: prescription NSAID's including Ketorolac 30 mg IV and narcotic analgesics including PCA Dilaudid. Has ambulated in the halls without difficulty, tolerating liquids and crackers,  has passed flatus but hasn't voided since Foley was removed.  Report some itching since bathing in pre-operative soap solution in spite of trying to  wipe it away (where she could reach) with wet wipes.   Objective: Vital signs in last 24 hours: Temp:  [97.5 F (36.4 C)-98.5 F (36.9 C)] 98.5 F (36.9 C) (02/13 0317) Pulse Rate:  [52-84] 60 (02/13 0317) Resp:  [9-18] 15 (02/13 0539) BP: (88-131)/(43-75) 118/50 (02/13 0317) SpO2:  [96 %-100 %] 99 % (02/13 0539) Weight:  [202 lb (91.6 kg)] 202 lb (91.6 kg) (02/12 1535)  Intake/Output from previous day: 02/12 0701 - 02/13 0700 In: 3406.7 [P.O.:240; I.V.:3166.7] Out: 2575 [Urine:2200] Intake/Output this shift: No intake/output data recorded.  Recent Labs Lab 03/23/16 0520  WBC 18.0*  HGB 10.5*  HCT 30.5*  PLT 248    No results for input(s): NA, K, CL, CO2, BUN, CREATININE, CALCIUM, PROT, BILITOT, ALKPHOS, ALT, AST, GLUCOSE in the last 168 hours.  Invalid input(s): LABALBU  EXAM: General: alert, cooperative and no distress Resp: clear to auscultation bilaterally Cardio: regular rate and rhythm, S1, S2 normal, no murmur, click, rub or gallop GI: Bowel sounds present, soft with umbilical dressing, clean/dry/intact; lower abdominal incisions with good would edge approximation and no evidence of infection. Extremities: Homans sign is negative, no sign of DVT and SCD hose in place with no calf tenderness.   Assessment: s/p Procedure(s): LAPAROSCOPIC ASSISTED VAGINAL HYSTERECTOMY WITH SALPINGECTOMY CYSTOSCOPY: stable, progressing well and  anemia  Plan: Advance diet Encourage ambulation Advance to PO medication Benadryl for complaints of pruritis  Consider discharge home today  LOS: 0 days    Dede Dobesh, PA-C 03/23/2016 7:44 AM

## 2016-04-07 ENCOUNTER — Encounter (HOSPITAL_COMMUNITY): Payer: 59

## 2016-05-17 ENCOUNTER — Other Ambulatory Visit (HOSPITAL_BASED_OUTPATIENT_CLINIC_OR_DEPARTMENT_OTHER): Payer: 59

## 2016-05-17 ENCOUNTER — Other Ambulatory Visit: Payer: Self-pay | Admitting: Hematology and Oncology

## 2016-05-17 DIAGNOSIS — Z9884 Bariatric surgery status: Secondary | ICD-10-CM

## 2016-05-17 DIAGNOSIS — E611 Iron deficiency: Secondary | ICD-10-CM

## 2016-05-17 DIAGNOSIS — E559 Vitamin D deficiency, unspecified: Secondary | ICD-10-CM | POA: Diagnosis not present

## 2016-05-17 LAB — FERRITIN: Ferritin: 12 ng/ml (ref 9–269)

## 2016-05-17 LAB — CBC & DIFF AND RETIC
BASO%: 0.5 % (ref 0.0–2.0)
BASOS ABS: 0 10*3/uL (ref 0.0–0.1)
EOS%: 1.5 % (ref 0.0–7.0)
Eosinophils Absolute: 0.1 10*3/uL (ref 0.0–0.5)
HCT: 41.3 % (ref 34.8–46.6)
HGB: 13.8 g/dL (ref 11.6–15.9)
Immature Retic Fract: 0.5 % — ABNORMAL LOW (ref 1.60–10.00)
LYMPH%: 31.4 % (ref 14.0–49.7)
MCH: 29.6 pg (ref 25.1–34.0)
MCHC: 33.4 g/dL (ref 31.5–36.0)
MCV: 88.4 fL (ref 79.5–101.0)
MONO#: 0.7 10*3/uL (ref 0.1–0.9)
MONO%: 9 % (ref 0.0–14.0)
NEUT%: 57.6 % (ref 38.4–76.8)
NEUTROS ABS: 4.7 10*3/uL (ref 1.5–6.5)
Platelets: 299 10*3/uL (ref 145–400)
RBC: 4.67 10*6/uL (ref 3.70–5.45)
RDW: 12.4 % (ref 11.2–14.5)
RETIC %: 0.97 % (ref 0.70–2.10)
Retic Ct Abs: 45.3 10*3/uL (ref 33.70–90.70)
WBC: 8.1 10*3/uL (ref 3.9–10.3)
lymph#: 2.6 10*3/uL (ref 0.9–3.3)

## 2016-05-18 LAB — VITAMIN D 25 HYDROXY (VIT D DEFICIENCY, FRACTURES): Vitamin D, 25-Hydroxy: 23.5 ng/mL — ABNORMAL LOW (ref 30.0–100.0)

## 2016-05-24 ENCOUNTER — Other Ambulatory Visit: Payer: Self-pay | Admitting: Hematology and Oncology

## 2016-05-24 ENCOUNTER — Ambulatory Visit (HOSPITAL_BASED_OUTPATIENT_CLINIC_OR_DEPARTMENT_OTHER): Payer: 59 | Admitting: Hematology and Oncology

## 2016-05-24 ENCOUNTER — Telehealth: Payer: Self-pay | Admitting: Hematology and Oncology

## 2016-05-24 ENCOUNTER — Ambulatory Visit: Payer: 59

## 2016-05-24 VITALS — BP 119/56 | HR 62 | Temp 98.4°F | Resp 18 | Ht 62.0 in | Wt 201.5 lb

## 2016-05-24 DIAGNOSIS — E559 Vitamin D deficiency, unspecified: Secondary | ICD-10-CM

## 2016-05-24 DIAGNOSIS — E611 Iron deficiency: Secondary | ICD-10-CM

## 2016-05-24 DIAGNOSIS — Z9884 Bariatric surgery status: Secondary | ICD-10-CM

## 2016-05-24 MED ORDER — ERGOCALCIFEROL 1.25 MG (50000 UT) PO CAPS: 50000.0000 [IU] | ORAL_CAPSULE | ORAL | 1 refills | Status: DC

## 2016-05-24 NOTE — Telephone Encounter (Signed)
Gave patient AVS and calender per 4/16 los. - patient only getting labs in 3 months. - no additional appts needed.

## 2016-05-25 ENCOUNTER — Encounter: Payer: Self-pay | Admitting: Hematology and Oncology

## 2016-05-25 NOTE — Assessment & Plan Note (Signed)
Technically, she is not anemic, although she is relatively mild iron deficient. In most gastric bypass patients, I would have prefer to see ferritin level closer to 50 She complain of fatigue. We discussed the risk and benefits of intravenous iron treatment and at present time she declined treatment Recommend repeat blood work in 3 months and I will call her with test results

## 2016-05-25 NOTE — Progress Notes (Signed)
[facility] OFFICE PROGRESS NOTE  No PCP Per Patient SUMMARY OF HEMATOLOGIC HISTORY:  Diane Gibbs is here because of problem with recurrent iron deficiency. The patient has history of gastric bypass many years ago. 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 I had the opportunity to review her blood count from 2012. Her hemoglobin has been as low as 6.1 to within normal limits at 13.5.  According to the most recent blood work dated 09/08/2015, CBC showed white count of 11.6, hemoglobin 13.5, MCV 85.3 but with evidence of iron deficiency with ferritin as well as for, 8% saturation but normal limits of vitamin B 12 level of 285 The patient had received blood transfusion in 2012. She had received multiple doses of intravenous iron before she relocated to [facility]. Her last endoscopy evaluation was in 2012 with normal EGD and colonoscopy. In October 2017, she received intravenous iron replacement therapy INTERVAL HISTORY: Diane Gibbs 51 y.o. female returns for follow-up. She is recovering well from recent hysterectomy She complained of fatigue The patient denies any recent signs or symptoms of bleeding such as spontaneous epistaxis, hematuria or hematochezia.   I have reviewed the past medical history, past surgical history, social history and family history with the patient and they are unchanged from previous note.  ALLERGIES:  has No Known Allergies.  MEDICATIONS:  Current Outpatient Prescriptions  Medication Sig Dispense Refill  . ALPRAZolam (XANAX) 1 MG tablet Take 1 mg by mouth at bedtime.     Marland Kitchen amphetamine-dextroamphetamine (ADDERALL) 20 MG tablet Take 20 mg by mouth 2 (two) times daily.    . cyanocobalamin (,VITAMIN B-12,) 1000 MCG/ML injection Inject 1,000 mcg into the muscle every 28 (twenty-eight) days.     . Eszopiclone (ESZOPICLONE) 3 MG TABS Take 3 mg by mouth  at bedtime. Take immediately before bedtime    . METRONIDAZOLE, TOPICAL, (METROLOTION) 0.75 % LOTN Apply 1 application topically 2 (two) times daily as needed (for rosacea).    . predniSONE (DELTASONE) 20 MG tablet Take 1 tab by mouth three times a day for three days, then take 1 tab twice a day for three days, then 1 tab once a day for three days    . pregabalin (LYRICA) 100 MG capsule Take 100 mg by mouth 2 (two) times daily.    . traMADol (ULTRAM) 50 MG tablet TAKE 1 TABLET 3 TIMES DAILY AS NEEDED    . triamcinolone ointment (KENALOG) 0.5 % Apply to affected area twice daily    . cyclobenzaprine (FLEXERIL) 10 MG tablet Take 10 mg by mouth 3 (three) times daily as needed for muscle spasms.    . ergocalciferol (VITAMIN D2) 50000 units capsule Take 1 capsule (50,000 Units total) by mouth once a week. 12 capsule 1  . ibuprofen (ADVIL,MOTRIN) 600 MG tablet 1  po  pc every 6 hours for 5 days then prn-pain (Patient not taking: Reported on 05/24/2016) 30 tablet 1  . oxyCODONE-acetaminophen (PERCOCET/ROXICET) 5-325 MG tablet Take 1-2 tablets by mouth every 6 (six) hours as needed for severe pain (moderate to severe pain (when tolerating fluids)). (Patient not taking: Reported on 05/24/2016) 30 tablet 0   No current facility-administered medications for this visit.      REVIEW OF SYSTEMS:   Constitutional: Denies fevers, chills or night sweats Eyes: Denies blurriness of vision Ears, nose, mouth, throat, and face: Denies mucositis or sore throat Respiratory: Denies  cough, dyspnea or wheezes Cardiovascular: Denies palpitation, chest discomfort or lower extremity swelling Gastrointestinal:  Denies nausea, heartburn or change in bowel habits Skin: Denies abnormal skin rashes Lymphatics: Denies new lymphadenopathy or easy bruising Neurological:Denies numbness, tingling or new weaknesses Behavioral/Psych: Mood is stable, no new changes  All other systems were reviewed with the patient and are  negative.  PHYSICAL EXAMINATION: ECOG PERFORMANCE STATUS: 0 - Asymptomatic  Vitals:   05/24/16 1406  BP: (!) 119/56  Pulse: 62  Resp: 18  Temp: 98.4 F (36.9 C)   Filed Weights   05/24/16 1406  Weight: 201 lb 8 oz (91.4 kg)    GENERAL:alert, no distress and comfortable SKIN: skin color, texture, turgor are normal, no rashes or significant lesions EYES: normal, Conjunctiva are pink and non-injected, sclera clear Musculoskeletal:no cyanosis of digits and no clubbing  NEURO: alert & oriented x 3 with fluent speech, no focal motor/sensory deficits  LABORATORY DATA:  I have reviewed the data as listed     Component Value Date/Time   NA 140 10/16/2010 1511   K 4.2 10/16/2010 1511   CL 105 10/16/2010 1511   CO2 29 10/16/2010 1511   GLUCOSE 80 10/16/2010 1511   BUN 10 10/16/2010 1511   CREATININE 0.6 10/16/2010 1511   CALCIUM 8.7 10/16/2010 1511   PROT 6.8 10/16/2010 1511   ALBUMIN 3.9 10/16/2010 1511   AST 21 10/16/2010 1511   ALT 26 10/16/2010 1511   ALKPHOS 52 10/16/2010 1511   BILITOT 0.4 10/16/2010 1511   GFRNONAA >60 09/16/2010 1722   GFRAA >60 09/16/2010 1722    No results found for: SPEP, UPEP  Lab Results  Component Value Date   WBC 8.1 05/17/2016   NEUTROABS 4.7 05/17/2016   HGB 13.8 05/17/2016   HCT 41.3 05/17/2016   MCV 88.4 05/17/2016   PLT 299 05/17/2016      Chemistry      Component Value Date/Time   NA 140 10/16/2010 1511   K 4.2 10/16/2010 1511   CL 105 10/16/2010 1511   CO2 29 10/16/2010 1511   BUN 10 10/16/2010 1511   CREATININE 0.6 10/16/2010 1511      Component Value Date/Time   CALCIUM 8.7 10/16/2010 1511   ALKPHOS 52 10/16/2010 1511   AST 21 10/16/2010 1511   ALT 26 10/16/2010 1511   BILITOT 0.4 10/16/2010 1511       ASSESSMENT & PLAN:  Iron deficiency Technically, she is not anemic, although she is relatively mild iron deficient. In most gastric bypass patients, I would have prefer to see ferritin level closer to  50 She complain of fatigue. We discussed the risk and benefits of intravenous iron treatment and at present time she declined treatment Recommend repeat blood work in 3 months and I will call her with test results   Vitamin D deficiency She has persistent vitamin D deficiency She has completed a prior high-dose prescription strength vitamin D replacement therapy but stopped after the prescription runs out I educated the patient that she needs to take vitamin D supplement long-term even after her prescription runs out I have prescribed another course of high-dose replacement therapy.  After her prescription is finished, I recommend vitamin D over-the-counter supplement at 2000 units per day   Orders Placed This Encounter  Procedures  . CBC & Diff and Retic    Standing Status:   Future    Standing Expiration Date:   06/28/2017  . Ferritin    Standing Status:  Future    Standing Expiration Date:   05/24/2017    All questions were answered. The patient knows to call the clinic with any problems, questions or concerns. No barriers to learning was detected.  I spent 10 minutes counseling the patient face to face. The total time spent in the appointment was 15 minutes and more than 50% was on counseling.     Heath Lark, MD 4/17/20186:33 AM

## 2016-05-25 NOTE — Assessment & Plan Note (Signed)
She has persistent vitamin D deficiency She has completed a prior high-dose prescription strength vitamin D replacement therapy but stopped after the prescription runs out I educated the patient that she needs to take vitamin D supplement long-term even after her prescription runs out I have prescribed another course of high-dose replacement therapy.  After her prescription is finished, I recommend vitamin D over-the-counter supplement at 2000 units per day

## 2016-05-31 ENCOUNTER — Ambulatory Visit: Payer: 59

## 2016-08-17 ENCOUNTER — Telehealth: Payer: Self-pay | Admitting: *Deleted

## 2016-08-17 ENCOUNTER — Other Ambulatory Visit (HOSPITAL_BASED_OUTPATIENT_CLINIC_OR_DEPARTMENT_OTHER): Payer: 59

## 2016-08-17 DIAGNOSIS — Z9884 Bariatric surgery status: Secondary | ICD-10-CM

## 2016-08-17 DIAGNOSIS — E611 Iron deficiency: Secondary | ICD-10-CM | POA: Diagnosis not present

## 2016-08-17 LAB — CBC & DIFF AND RETIC
BASO%: 0.6 % (ref 0.0–2.0)
BASOS ABS: 0 10*3/uL (ref 0.0–0.1)
EOS ABS: 0.1 10*3/uL (ref 0.0–0.5)
EOS%: 1.9 % (ref 0.0–7.0)
HEMATOCRIT: 40 % (ref 34.8–46.6)
HEMOGLOBIN: 13.3 g/dL (ref 11.6–15.9)
Immature Retic Fract: 2.2 % (ref 1.60–10.00)
LYMPH%: 33.4 % (ref 14.0–49.7)
MCH: 29 pg (ref 25.1–34.0)
MCHC: 33.3 g/dL (ref 31.5–36.0)
MCV: 87.3 fL (ref 79.5–101.0)
MONO#: 0.7 10*3/uL (ref 0.1–0.9)
MONO%: 11.9 % (ref 0.0–14.0)
NEUT#: 3.2 10*3/uL (ref 1.5–6.5)
NEUT%: 52.2 % (ref 38.4–76.8)
PLATELETS: 333 10*3/uL (ref 145–400)
RBC: 4.58 10*6/uL (ref 3.70–5.45)
RDW: 13.8 % (ref 11.2–14.5)
RETIC %: 0.94 % (ref 0.70–2.10)
RETIC CT ABS: 43.05 10*3/uL (ref 33.70–90.70)
WBC: 6.2 10*3/uL (ref 3.9–10.3)
lymph#: 2.1 10*3/uL (ref 0.9–3.3)

## 2016-08-17 LAB — FERRITIN: Ferritin: 12 ng/ml (ref 9–269)

## 2016-08-17 NOTE — Telephone Encounter (Signed)
-----   Message from Heath Lark, MD sent at 08/17/2016  3:57 PM EDT ----- Regarding: labs Repeat labs are stable Ferritin a bit low but she is not anemia Recommend follow-up labs with PCP at the end of the year ----- Message ----- From: Interface, Lab In Three Zero One Sent: 08/17/2016  11:51 AM To: Heath Lark, MD

## 2016-08-17 NOTE — Telephone Encounter (Signed)
Notified of message below

## 2016-08-23 ENCOUNTER — Other Ambulatory Visit: Payer: 59

## 2016-11-09 ENCOUNTER — Other Ambulatory Visit: Payer: Self-pay | Admitting: Hematology and Oncology

## 2016-11-10 ENCOUNTER — Other Ambulatory Visit: Payer: Self-pay | Admitting: Hematology and Oncology

## 2016-11-22 ENCOUNTER — Other Ambulatory Visit: Payer: Self-pay | Admitting: Hematology and Oncology

## 2017-04-21 ENCOUNTER — Other Ambulatory Visit: Payer: Self-pay

## 2017-04-21 ENCOUNTER — Encounter (HOSPITAL_COMMUNITY): Payer: Self-pay | Admitting: *Deleted

## 2017-04-21 DIAGNOSIS — Z5321 Procedure and treatment not carried out due to patient leaving prior to being seen by health care provider: Secondary | ICD-10-CM | POA: Insufficient documentation

## 2017-04-21 DIAGNOSIS — M545 Low back pain: Secondary | ICD-10-CM | POA: Insufficient documentation
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
## 2017-04-22 ENCOUNTER — Emergency Department (HOSPITAL_COMMUNITY)
Admission: EM | Admit: 2017-04-22 | Discharge: 2017-04-22 | Disposition: A | Discharge status: Home / Self Care | Payer: 59 | Attending: Emergency Medicine | Admitting: Emergency Medicine

## 2023-09-20 ENCOUNTER — Other Ambulatory Visit: Payer: Self-pay

## 2023-09-20 ENCOUNTER — Ambulatory Visit: Admitting: Allergy

## 2023-09-20 ENCOUNTER — Encounter: Payer: Self-pay | Admitting: Allergy

## 2023-09-20 VITALS — BP 120/70 | HR 58 | Temp 97.1°F | Resp 18 | Ht 62.0 in | Wt 126.0 lb

## 2023-09-20 DIAGNOSIS — L259 Unspecified contact dermatitis, unspecified cause: Secondary | ICD-10-CM | POA: Diagnosis not present

## 2023-09-20 DIAGNOSIS — J3089 Other allergic rhinitis: Secondary | ICD-10-CM

## 2023-09-20 DIAGNOSIS — R053 Chronic cough: Secondary | ICD-10-CM | POA: Diagnosis not present

## 2023-09-20 MED ORDER — AIRSUPRA 90-80 MCG/ACT IN AERO: 2.0000 | INHALATION_SPRAY | RESPIRATORY_TRACT | 2 refills | Status: AC | PRN

## 2023-09-20 NOTE — Patient Instructions (Addendum)
 Skin  Keep track of rashes and take pictures. See below for proper skin care. Use fragrance free and dye free products. No dryer sheets or fabric softener.   Wear long pants when you don't know what environment you are going to be in.   Environmental allergies Start environmental control measures as below. Suspicious for dog allergy.  Use over the counter antihistamines such as Zyrtec (cetirizine), Claritin (loratadine), Allegra (fexofenadine), or Xyzal (levocetirizine) daily as needed. May switch antihistamines every few months. Consider allergy injections for long term control if above medications do not help the symptoms - handout given.  Get bloodwork We are ordering labs, so please allow 1-2 weeks for the results to come back. With the newly implemented Cures Act, the labs might be visible to you at the same time that they become visible to me. However, I will not address the results until all of the results are back, so please be patient.  In the meantime, continue recommendations in your patient instructions, including avoidance measures (if applicable), until you hear from me.  Breathing Normal breathing test today. May use Airsupra  rescue inhaler 2 puffs every 4 to 6 hours as needed for shortness of breath, chest tightness, coughing, and wheezing. Do not use more than 12 puffs in 24 hours. May use Airsupra  rescue inhaler 2 puffs 5 to 15 minutes prior to strenuous physical activities. Rinse mouth after each use.  Coupon given.  Monitor frequency of use - if you need to use it more than twice per week on a consistent basis let us  know.   Poison ivy dermatitis I don't have any testing for this.  Recommend avoidance. See handout.   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  Follow up in 6 months or sooner if needed.   Pet Allergen Avoidance: Contrary to popular opinion, there are no "hypoallergenic" breeds of dogs or cats. That is  because people are not allergic to an animal's hair, but to an allergen found in the animal's saliva, dander (dead skin flakes) or urine. Pet allergy symptoms typically occur within minutes. For some people, symptoms can build up and become most severe 8 to 12 hours after contact with the animal. People with severe allergies can experience reactions in public places if dander has been transported on the pet owners' clothing. Keeping an animal outdoors is only a partial solution, since homes with pets in the yard still have higher concentrations of animal allergens. Before getting a pet, ask your allergist to determine if you are allergic to animals. If your pet is already considered part of your family, try to minimize contact and keep the pet out of the bedroom and other rooms where you spend a great deal of time. As with dust mites, vacuum carpets often or replace carpet with a hardwood floor, tile or linoleum. High-efficiency particulate air (HEPA) cleaners can reduce allergen levels over time. While dander and saliva are the source of cat and dog allergens, urine is the source of allergens from rabbits, hamsters, mice and [place] pigs; so ask a non-allergic family member to clean the animal's cage. If you have a pet allergy, talk to your allergist about the potential for allergy immunotherapy (allergy shots). This strategy can often provide long-term relief.   Skin care recommendations  Bath time: Always use lukewarm water . AVOID very hot or cold water . Keep bathing time to 5-10 minutes. Do NOT use bubble bath. Use a mild soap and use just enough to wash the  dirty areas. Do NOT scrub skin vigorously.  After bathing, pat dry your skin with a towel. Do NOT rub or scrub the skin.  Moisturizers and prescriptions:  ALWAYS apply moisturizers immediately after bathing (within 3 minutes). This helps to lock-in moisture. Use the moisturizer several times a day over the whole body. Good summer  moisturizers include: Aveeno, CeraVe, Cetaphil. Good winter moisturizers include: Aquaphor, Vaseline, Cerave, Cetaphil, Eucerin, Vanicream. When using moisturizers along with medications, the moisturizer should be applied about one hour after applying the medication to prevent diluting effect of the medication or moisturize around where you applied the medications. When not using medications, the moisturizer can be continued twice daily as maintenance.  Laundry and clothing: Avoid laundry products with added color or perfumes. Use unscented hypo-allergenic laundry products such as Tide free, Cheer free & gentle, and All free and clear.  If the skin still seems dry or sensitive, you can try double-rinsing the clothes. Avoid tight or scratchy clothing such as wool. Do not use fabric softeners or dyer sheets.

## 2023-09-20 NOTE — Progress Notes (Signed)
 New Patient Note  RE: [name] MRN: [id number] DOB: 1965/04/01 Date of Office Visit: 09/20/2023  Consult requested by: Jesus Elberta Gainer, * Primary care provider: Patient, No Pcp Per  Chief Complaint: Allergic Reaction (Poison ivy, cleaning products - legs burn from sitting on a freshly clean chair takes weeks for the hot itchy flare to go away // Dye in clothing, newspaper - coughing, and some shortness of breath. Throat drainage /) and Other (Has a genetic skin disease Incontinentia Pigmenti)  History of Present Illness: I had the pleasure of seeing Diane Gibbs for initial evaluation at the [facility] on 09/20/2023. She is a 58 y.o. female, who is referred here by Jesus Elberta Gainer, * for the evaluation of rash.  Discussed the use of AI scribe software for clinical note transcription with the patient, who gave verbal consent to proceed.    She has experienced a rash on the backs of her legs for about three years. The rash appears when she sits on certain surfaces, possibly due to cleaning products, and is characterized by redness, tiny blisters, and itching. It extends from where her shorts end downwards and takes one to two weeks to resolve with the use of steroid creams, which provide minimal relief.  She experiences respiratory symptoms, including coughing, when exposed to certain dyes found in cheap clothing or newsprint. The coughing persists until she leaves the area and resolves about twenty minutes later. There is a family history of similar symptoms, with her mother and grandmother experiencing coughing related to newsprint dye. She reports throat drainage and itchy eyes and nose when exposed to dog hair.  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 She is part of a support group for this condition and notes that while it is severe in infants, the long-term effects in adults are  not well understood.   She has frequent poison ivy exposure, requiring multiple steroid shots and antibiotics. No other significant allergies, including food or medication allergies.  Her social history includes living with four dogs, which she vacuums after daily, and she reports that dog hair causes her nose to run and eyes to itch. She has not undergone prior allergy testing.     Assessment and Plan: [name] is a 58 y.o. female with: Contact dermatitis, unspecified contact dermatitis type, unspecified trigger Rash on legs likely due to contact with cleaning products or plastic seats. Symptoms resolve with avoidance and topical steroids.  Keep track of rashes and take pictures. See below for proper skin care. Use fragrance free and dye free products. No dryer sheets or fabric softener.   Wear long pants to limit contact with unknown trigger/allergen to the skin.  No testing for poison ivy - gave handout on avoidance measures.   Other allergic rhinitis Chronic nasal symptoms and itchy eyes, possibly related to dog exposure. Advised against long-term Benadryl  use due to cognitive effects. No prior testing. 4 dogs at home.  Start environmental control measures as below. Suspicious for dog allergy.  Use over the counter antihistamines such as Zyrtec (cetirizine), Claritin (loratadine), Allegra (fexofenadine), or Xyzal (levocetirizine) daily as needed. May switch antihistamines every few months. Consider allergy injections for long term control if above medications do not help the symptoms - handout given.  Get bloodwork.  Chronic cough Intermittent coughing episodes triggered by dyes or chemicals. Possible reactive airway disease. No prior asthma diagnosis or inhaler use. Today's spirometry was normal.  May use Airsupra  rescue inhaler 2 puffs every 4 to 6 hours as needed for shortness of breath, chest tightness, coughing, and wheezing. Do not use more than 12 puffs in 24 hours. May use  Airsupra  rescue inhaler 2 puffs 5 to 15 minutes prior to strenuous physical activities. Rinse mouth after each use.  Coupon given.  Monitor frequency of use - if you need to use it more than twice per week on a consistent basis let us  know.   Return in about 6 months (around 03/22/2024).  Meds ordered this encounter  Medications   Albuterol-Budesonide (AIRSUPRA ) 90-80 MCG/ACT AERO    Sig: Inhale 2 puffs into the lungs every 4 (four) hours as needed (coughing, wheezing, chest tightness). Do not exceed 12 puffs in 24 hours.    Dispense:  10.7 g    Refill:  2    BIN: [id number], PCN: [id number], GRP: [id number], ID [id number]   Lab Orders         Allergens w/Total IgE Area 2      Other allergy screening: Rhino conjunctivitis:  Some symptoms around the dogs.  Food allergy: no Medication allergy: no Hymenoptera allergy: no Urticaria: no Eczema:no History of recurrent infections suggestive of immunodeficency: no  Diagnostics: Spirometry:  Tracings reviewed. Her effort: Good reproducible efforts. FVC: 2.73L FEV1: 2.55L, 107% predicted FEV1/FVC ratio: 93% Interpretation: Spirometry consistent with normal pattern.  Please see scanned spirometry results for details.  Results discussed with patient/family.  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   INSOMNIA 01/25/2007   History of colonic polyps 01/25/2007   Personal history  presenting hazards to health 01/25/2007   Past Medical History:  Diagnosis Date   ADD (attention deficit disorder)    Anemia    B12 deficiency    Depression    Fibromyalgia    Genital warts    GERD (gastroesophageal reflux disease)    gastric bypass   Hx of colonic polyps    Hx of infectious mononucleosis    Incontinentia pigmenti    Insomnia    Leg pain, bilateral    Vitamin D  deficiency    Past Surgical History: Past Surgical History:  Procedure Laterality Date   ABDOMINAL HYSTERECTOMY     bilateral breast reduction     CESAREAN SECTION     COLONOSCOPY     CYSTOSCOPY N/A 03/22/2016   Procedure: CYSTOSCOPY;  Surgeon: Ovid All, MD;  Location: [facility];  Service: Gynecology;  Laterality: N/A;   GASTRIC BYPASS     LAPAROSCOPIC VAGINAL HYSTERECTOMY WITH SALPINGECTOMY Bilateral 03/22/2016   Procedure: LAPAROSCOPIC ASSISTED VAGINAL HYSTERECTOMY WITH SALPINGECTOMY;  Surgeon: Ovid All, MD;  Location: [facility];  Service: Gynecology;  Laterality: Bilateral;   sphinceterotomy     Medication List:  Current Outpatient Medications  Medication Sig Dispense Refill   Albuterol-Budesonide (AIRSUPRA ) 90-80 MCG/ACT AERO Inhale 2 puffs into the lungs every 4 (four) hours as needed (coughing, wheezing, chest tightness). Do not exceed 12  puffs in 24 hours. 10.7 g 2   ALPRAZolam  (XANAX ) 1 MG tablet Take 1 mg by mouth at bedtime.      amphetamine -dextroamphetamine  (ADDERALL) 20 MG tablet Take 20 mg by mouth 2 (two) times daily.     cyanocobalamin (,VITAMIN B-12,) 1000 MCG/ML injection Inject 1,000 mcg into the muscle every 28 (twenty-eight) days.      cyclobenzaprine  (FLEXERIL ) 10 MG tablet Take 10 mg by mouth 3 (three) times daily as needed for muscle spasms.     METRONIDAZOLE, TOPICAL, (METROLOTION) 0.75 % LOTN Apply 1 application topically 2 (two) times daily as needed (for rosacea).     pregabalin  (LYRICA ) 100 MG capsule Take 100 mg by mouth 2 (two) times daily.     traMADol (ULTRAM) 50 MG  tablet TAKE 1 TABLET 3 TIMES DAILY AS NEEDED     triamcinolone ointment (KENALOG) 0.5 % Apply to affected area twice daily     Eszopiclone (ESZOPICLONE) 3 MG TABS Take 3 mg by mouth at bedtime. Take immediately before bedtime     No current facility-administered medications for this visit.   Allergies: Allergies  Allergen Reactions   Eszopiclone Other (See Comments)    Bad taste  Bad taste  Bad taste    Bad taste   Gabapentin  Other (See Comments)    Unknown   Unknown     Unknown   Unknown  Unknown   Unknown   Other Other (See Comments)   Zolpidem Other (See Comments)    Parasomnias  Parasomnias  Parasomnias    Parasomnias  Parasomnias Parasomnias  Parasomnias   Social History: Social History   Socioeconomic History   Marital status: Married    Spouse name: Joe   Number of children: 2   Years of education: college   Highest education level: Not on file  Occupational History   Occupation: Radio producer  Tobacco Use   Smoking status: Former    Current packs/day: 0.00    Types: Cigarettes    Quit date: 02/08/2005    Years since quitting: 18.6   Smokeless tobacco: Never  Substance and Sexual Activity   Alcohol use: No    Alcohol/week: 0.0 standard drinks of alcohol   Drug use: No   Sexual activity: Not on file  Other Topics Concern   Not on file  Social History Narrative   Drinks about 4 cups of tea a day    Social Drivers of Corporate investment banker Strain: Not on file  Food Insecurity: Not on file  Transportation Needs: Not on file  Physical Activity: Not on file  Stress: Not on file  Social Connections: Not on file   Lives in a house. 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, applications History: Water  Damage/mildew in the house: no Carpet in the family room: no Carpet in the bedroom: no Heating: heat pump Cooling: heat pump Pet: yes 4 dogs  Family History: Family History  Problem Relation Age of Onset   Cancer Other         colon cancer   Depression Other    Hypertension Other    Diabetes Father    Colon cancer Father    Hypertension Father    Problem                               Relation Asthma  Mother  Eczema                                no Food allergy                          no Allergic rhino conjunctivitis     no  Review of Systems  Constitutional:  Negative for appetite change, chills, fever and unexpected weight change.  HENT:  Negative for congestion and rhinorrhea.   Eyes:  Negative for itching.  Respiratory:  Positive for cough. Negative for chest tightness, shortness of breath and wheezing.   Cardiovascular:  Negative for chest pain.  Gastrointestinal:  Negative for abdominal pain.  Genitourinary:  Negative for difficulty urinating.  Skin:  Positive for rash.  Neurological:  Negative for headaches.    Objective: BP 120/70 (BP Location: Right Arm, Patient Position: Sitting, Cuff Size: Normal)   Pulse (!) 58   Temp (!) 97.1 F (36.2 C) (Temporal)   Resp 18   Ht 5' 2 (1.575 m)   Wt 126 lb (57.2 kg)   LMP 02/16/2016 (Approximate)   SpO2 98%   BMI 23.05 kg/m  Body mass index is 23.05 kg/m. Physical Exam Vitals and nursing note reviewed.  Constitutional:      Appearance: Normal appearance. She is well-developed.  HENT:     Head: Normocephalic and atraumatic.     Right Ear: Tympanic membrane and external ear normal.     Left Ear: Tympanic membrane and external ear normal.     Nose: Nose normal.     Mouth/Throat:     Mouth: Mucous membranes are moist.     Pharynx: Oropharynx is clear.  Eyes:     Conjunctiva/sclera: Conjunctivae normal.  Cardiovascular:     Rate and Rhythm: Normal rate and regular rhythm.     Heart sounds: Normal heart sounds. No murmur heard.    No friction rub. No gallop.  Pulmonary:     Effort: Pulmonary effort is normal.     Breath sounds: Normal breath sounds. No wheezing, rhonchi or rales.  Musculoskeletal:      Cervical back: Neck supple.  Skin:    General: Skin is warm.     Findings: No rash.  Neurological:     Mental Status: She is alert and oriented to person, place, and time.  Psychiatric:        Behavior: Behavior normal.    The plan was reviewed with the patient/family, and all questions/concerned were addressed.  It was my pleasure to see Esmay today and participate in her care. Please feel free to contact me with any questions or concerns.  Sincerely,  Orlan Cramp, DO Allergy & Immunology  [facility]  [place] office: [phone] [facility] office: [phone]

## 2024-03-22 ENCOUNTER — Ambulatory Visit: Admitting: Allergy
